# Patient Record
Sex: Female | Born: 1960
Health system: Southern US, Community
[De-identification: ages and names within clinical notes are randomized; demographics above are authoritative.]

## PROBLEM LIST (undated history)

## (undated) DIAGNOSIS — T4145XA Adverse effect of unspecified anesthetic, initial encounter: Secondary | ICD-10-CM

## (undated) DIAGNOSIS — E7800 Pure hypercholesterolemia, unspecified: Secondary | ICD-10-CM

## (undated) DIAGNOSIS — E039 Hypothyroidism, unspecified: Secondary | ICD-10-CM

## (undated) DIAGNOSIS — G4733 Obstructive sleep apnea (adult) (pediatric): Secondary | ICD-10-CM

## (undated) DIAGNOSIS — T8859XA Other complications of anesthesia, initial encounter: Secondary | ICD-10-CM

## (undated) DIAGNOSIS — K219 Gastro-esophageal reflux disease without esophagitis: Secondary | ICD-10-CM

## (undated) HISTORY — DX: Pure hypercholesterolemia, unspecified: E78.00

## (undated) HISTORY — PX: TONSILLECTOMY: SUR1361

## (undated) HISTORY — DX: Obstructive sleep apnea (adult) (pediatric): G47.33

## (undated) HISTORY — DX: Morbid (severe) obesity due to excess calories: E66.01

## (undated) HISTORY — DX: Hypothyroidism, unspecified: E03.9

---

## 1990-10-02 HISTORY — PX: GANGLION CYST EXCISION: SHX1691

## 1997-10-02 HISTORY — PX: DILATION AND CURETTAGE OF UTERUS: SHX78

## 2007-12-12 DIAGNOSIS — R7989 Other specified abnormal findings of blood chemistry: Secondary | ICD-10-CM | POA: Insufficient documentation

## 2008-11-25 DIAGNOSIS — L0292 Furuncle, unspecified: Secondary | ICD-10-CM | POA: Insufficient documentation

## 2008-11-25 DIAGNOSIS — L259 Unspecified contact dermatitis, unspecified cause: Secondary | ICD-10-CM | POA: Insufficient documentation

## 2008-12-02 DIAGNOSIS — Z8249 Family history of ischemic heart disease and other diseases of the circulatory system: Secondary | ICD-10-CM | POA: Insufficient documentation

## 2009-11-24 DIAGNOSIS — F4320 Adjustment disorder, unspecified: Secondary | ICD-10-CM | POA: Insufficient documentation

## 2010-01-06 DIAGNOSIS — R5381 Other malaise: Secondary | ICD-10-CM | POA: Insufficient documentation

## 2011-10-20 ENCOUNTER — Ambulatory Visit: Payer: Self-pay | Admitting: Family Medicine

## 2012-01-12 ENCOUNTER — Ambulatory Visit: Payer: Self-pay | Admitting: Family Medicine

## 2013-01-08 ENCOUNTER — Ambulatory Visit (INDEPENDENT_AMBULATORY_CARE_PROVIDER_SITE_OTHER): Payer: BC Managed Care – PPO | Admitting: General Surgery

## 2013-02-05 ENCOUNTER — Encounter (INDEPENDENT_AMBULATORY_CARE_PROVIDER_SITE_OTHER): Payer: Self-pay | Admitting: General Surgery

## 2013-02-05 ENCOUNTER — Ambulatory Visit (INDEPENDENT_AMBULATORY_CARE_PROVIDER_SITE_OTHER): Payer: BC Managed Care – PPO | Admitting: General Surgery

## 2013-02-05 VITALS — BP 122/78 | HR 64 | Resp 16 | Ht 61.0 in | Wt 280.0 lb

## 2013-02-05 DIAGNOSIS — Z6841 Body Mass Index (BMI) 40.0 and over, adult: Secondary | ICD-10-CM

## 2013-02-05 NOTE — Patient Instructions (Signed)
We will start our process. Please call with any questions.

## 2013-02-05 NOTE — Progress Notes (Signed)
Patient ID: Melissa Cherry, female   DOB: 1961/04/11, 52 y.o.   MRN: [id number]  Chief Complaint  Patient presents with  . Weight Loss Surgery    Gastric Bypass    HPI Melissa Cherry is a 52 y.o. female.   HPI 52 yo morbidly obese WF referred by Dr Leary Roca for evaluation of weight loss surgery. The patient states she is specifically interested in gastric bypass surgery. She states that she doesn't want any foreign bodies like the LapBand in herbody. She states that also the numerous return visits for LapBand are not conducive to her schedule. She states that the sleeve gastrectomy doesn't appeal to her since it is nonreversible and completely permanent. She states that she has researched gastric bypass and feels this is the best procedure for her.  She states she struggle with her weight most of her life however over the past several years her weight has increased. Despite numerous attempts for sustained weight loss the patient has been unsuccessful. She has tried [employer], bariatric clinic, [employer], and professional weight loss all without any long-term success. She was most successful with professional weight loss clinics when she was in her 93s.  Her comorbidities include obstructive sleep apnea on CPAP and hypercholesterolemia Past Medical History  Diagnosis Date  . Hypercholesteremia   . OSA (obstructive sleep apnea)     Past Surgical History  Procedure Laterality Date  . Tonsillectomy  as child  . Ganglion cyst excision Right 1992  . Cesarean section  200, 2003    Family History  Problem Relation Age of Onset  . Diabetes Father   . Breast cancer Mother     Social History History  Substance Use Topics  . Smoking status: Former Smoker    Quit date: 10/02/1996  . Smokeless tobacco: Not on file  . Alcohol Use: Yes     Comment: occasionally    Allergies  Allergen Reactions  . Tamiflu (Oseltamivir Phosphate) Rash    Current Outpatient Prescriptions  Medication  Sig Dispense Refill  . loratadine (CLARITIN) 10 MG tablet Take 10 mg by mouth daily.       No current facility-administered medications for this visit.    Review of Systems Review of Systems  Constitutional: Negative for fever, chills and unexpected weight change.  HENT: Negative for hearing loss, congestion, sore throat, trouble swallowing and voice change.   Eyes: Negative for visual disturbance.  Respiratory: Negative for cough, shortness of breath and wheezing.        Uses CPAP  Cardiovascular: Negative for chest pain, palpitations and leg swelling.       Some DOE after 1 flight; no PND, orthopnea  Gastrointestinal: Negative for nausea, vomiting, abdominal pain, diarrhea, constipation, blood in stool, abdominal distention and anal bleeding.       Reports nml screening colonoscopy; occasional reflux  Genitourinary: Negative for hematuria, vaginal bleeding, difficulty urinating and dyspareunia.       +menopause; G4P2; c/s x 2; some occasional urinary incontinence with cough/sneeze  Musculoskeletal: Negative for myalgias, back pain and arthralgias.  Skin: Negative for rash and wound.  Neurological: Negative for seizures, syncope and headaches.       Denies TIA, amaurosis fugax  Hematological: Negative for adenopathy. Does not bruise/bleed easily.  Psychiatric/Behavioral: Negative for confusion.    Blood pressure 122/78, pulse 64, resp. rate 16, height 5\' 1"  (1.549 m), weight 280 lb (127.007 kg).  Physical Exam Physical Exam  Vitals reviewed. Constitutional: She is oriented to person, place,  and time. She appears well-developed and well-nourished. No distress.  Morbid obese; apple shaped  HENT:  Head: Normocephalic and atraumatic.  Right Ear: External ear normal.  Left Ear: External ear normal.  Eyes: Conjunctivae are normal. No scleral icterus.  Neck: Normal range of motion. Neck supple. No tracheal deviation present. No thyromegaly present.  Cardiovascular: Normal rate,  regular rhythm, normal heart sounds and intact distal pulses.   Pulmonary/Chest: Effort normal and breath sounds normal. No respiratory distress. She has no wheezes.  Abdominal: Soft. Bowel sounds are normal. She exhibits no distension. There is no tenderness. There is no rebound and no guarding.    Musculoskeletal: Normal range of motion. She exhibits no edema and no tenderness.  Neurological: She is alert and oriented to person, place, and time. She exhibits normal muscle tone.  Skin: Skin is warm and dry. No rash noted. She is not diaphoretic. No erythema.  Psychiatric: She has a normal mood and affect. Her behavior is normal. Judgment and thought content normal.    Data Reviewed None available  Assessment    Morbid obesity BMI 52.9 OSA on CPAP Hypercholesterolemia     Plan    The patient meets weight loss surgery criteria. I think the patient would be an acceptable candidate for Laparoscopic Roux-en-Y Gastric bypass. She appears to have done adequate research  We discussed laparoscopic Roux-en-Y gastric bypass. We discussed the preoperative, operative and postoperative process. Using diagrams, I explained the surgery in detail including the performance of an EGD near the end of the surgery and an Upper GI swallow study on POD 1. We discussed the typical hospital course including a 2-3 day stay baring any complications.   The patient was given educational material. I quoted the patient that they can expect to lose 50-70% of their excess weight with the gastric bypass. We did discuss the possibility of weight regain several years after the procedure.  We discussed the risk and benefits of surgery including but not limited to anesthesia risk, bleeding, infection, anastomotic edema requiring a few additional days in the hospital, postop nausea, blood clot formation, anastomotic leak, anastomotic stricture, ulcer formation, death, respiratory complications, intestinal blockage, internal  hernia, gallstone formation, vitamin and nutritional deficiencies, injury to surrounding structures, failure to lose weight and mood changes.  We discussed that before and after surgery that there would be an alteration in their diet. I explained that we have put them on a diet 2 weeks before surgery. I also explained that they would be on a liquid diet for 2 weeks after surgery. We discussed that they would have to avoid certain foods such as sugar after surgery. We discussed the importance of physical activity as well as compliance with our dietary and supplement recommendations and routine follow-up.  I explained to the patient that we will start our evaluation process which includes labs, Upper GI to evaluate stomach and swallowing anatomy, nutritionist consultation, psychiatrist consultation, EKG, CXR, abdominal ultrasound,.  [name]. [name], MD, FACS General, Bariatric, & Minimally Invasive Surgery [facility] Surgery, [place]             [facility] M 02/05/2013, 12:50 PM

## 2013-02-19 LAB — COMPREHENSIVE METABOLIC PANEL
Albumin: 4 g/dL (ref 3.5–5.2)
CO2: 26 mEq/L (ref 19–32)
Calcium: 9.6 mg/dL (ref 8.4–10.5)
Glucose, Bld: 86 mg/dL (ref 70–99)
Potassium: 4.3 mEq/L (ref 3.5–5.3)
Sodium: 141 mEq/L (ref 135–145)
Total Protein: 6.7 g/dL (ref 6.0–8.3)

## 2013-02-19 LAB — TSH: TSH: 5.054 u[IU]/mL — ABNORMAL HIGH (ref 0.350–4.500)

## 2013-02-19 LAB — LIPID PANEL: Cholesterol: 279 mg/dL — ABNORMAL HIGH (ref 0–200)

## 2013-02-19 LAB — CBC WITH DIFFERENTIAL/PLATELET
Eosinophils Absolute: 0.2 10*3/uL (ref 0.0–0.7)
HCT: 41.7 % (ref 36.0–46.0)
Hemoglobin: 14.4 g/dL (ref 12.0–15.0)
Lymphs Abs: 2 10*3/uL (ref 0.7–4.0)
MCH: 30 pg (ref 26.0–34.0)
Monocytes Relative: 8 % (ref 3–12)
Neutro Abs: 3 10*3/uL (ref 1.7–7.7)
Neutrophils Relative %: 52 % (ref 43–77)
RBC: 4.8 MIL/uL (ref 3.87–5.11)

## 2013-02-20 LAB — H. PYLORI ANTIBODY, IGG: H Pylori IgG: 0.54 {ISR}

## 2013-02-25 ENCOUNTER — Ambulatory Visit (HOSPITAL_COMMUNITY)
Admission: RE | Admit: 2013-02-25 | Discharge: 2013-02-25 | Disposition: A | Discharge status: Home / Self Care | Payer: BC Managed Care – PPO | Source: Ambulatory Visit | Attending: General Surgery | Admitting: General Surgery

## 2013-02-25 ENCOUNTER — Other Ambulatory Visit: Payer: Self-pay

## 2013-02-25 DIAGNOSIS — J301 Allergic rhinitis due to pollen: Secondary | ICD-10-CM | POA: Insufficient documentation

## 2013-02-25 DIAGNOSIS — E7800 Pure hypercholesterolemia, unspecified: Secondary | ICD-10-CM | POA: Insufficient documentation

## 2013-02-25 DIAGNOSIS — K449 Diaphragmatic hernia without obstruction or gangrene: Secondary | ICD-10-CM | POA: Insufficient documentation

## 2013-02-25 DIAGNOSIS — G4733 Obstructive sleep apnea (adult) (pediatric): Secondary | ICD-10-CM | POA: Insufficient documentation

## 2013-02-25 DIAGNOSIS — K7689 Other specified diseases of liver: Secondary | ICD-10-CM | POA: Insufficient documentation

## 2013-02-25 DIAGNOSIS — Z6841 Body Mass Index (BMI) 40.0 and over, adult: Secondary | ICD-10-CM | POA: Insufficient documentation

## 2013-02-26 ENCOUNTER — Encounter: Payer: Self-pay | Admitting: *Deleted

## 2013-02-26 ENCOUNTER — Encounter: Payer: BC Managed Care – PPO | Attending: General Surgery | Admitting: *Deleted

## 2013-02-26 VITALS — Ht 61.0 in | Wt 279.1 lb

## 2013-02-26 DIAGNOSIS — Z01818 Encounter for other preprocedural examination: Secondary | ICD-10-CM | POA: Insufficient documentation

## 2013-02-26 DIAGNOSIS — Z713 Dietary counseling and surveillance: Secondary | ICD-10-CM | POA: Insufficient documentation

## 2013-02-26 DIAGNOSIS — Z6841 Body Mass Index (BMI) 40.0 and over, adult: Secondary | ICD-10-CM

## 2013-02-26 NOTE — Progress Notes (Signed)
  Pre-Op Assessment Visit:  Pre-Operative RYGB Surgery  Medical Nutrition Therapy:  Appt start time: 0930   End time:  1030.  Patient was seen on 02/26/2013 for Pre-Operative RYGB Nutrition Assessment. Assessment and letter of approval faxed to [facility] Bariatric Surgery Program coordinator on 02/26/2013.  Approval letter sent to [facility] Scan center and will be available in the chart under the media tab.  Handouts given during visit include:  Pre-Op Goals   Bariatric Surgery Protein Shakes  Patient to call for Pre-Op and Post-Op Nutrition Education at the [facility] when surgery is scheduled.

## 2013-02-26 NOTE — Patient Instructions (Addendum)
   Follow Pre-Op Nutrition Goals to prepare for Gastric Bypass Surgery.   Call the [facility] at [phone] once you have been given your surgery date to enrolled in the Pre-Op Nutrition Class. You will need to attend this nutrition class 3-4 weeks prior to your surgery. 

## 2013-03-25 ENCOUNTER — Other Ambulatory Visit (INDEPENDENT_AMBULATORY_CARE_PROVIDER_SITE_OTHER): Payer: Self-pay | Admitting: General Surgery

## 2013-04-02 ENCOUNTER — Ambulatory Visit (INDEPENDENT_AMBULATORY_CARE_PROVIDER_SITE_OTHER): Payer: BC Managed Care – PPO | Admitting: General Surgery

## 2013-04-02 ENCOUNTER — Encounter (INDEPENDENT_AMBULATORY_CARE_PROVIDER_SITE_OTHER): Payer: Self-pay | Admitting: General Surgery

## 2013-04-02 VITALS — BP 124/84 | HR 86 | Resp 16 | Ht 61.5 in | Wt 277.6 lb

## 2013-04-02 DIAGNOSIS — E7800 Pure hypercholesterolemia, unspecified: Secondary | ICD-10-CM

## 2013-04-02 DIAGNOSIS — G4733 Obstructive sleep apnea (adult) (pediatric): Secondary | ICD-10-CM

## 2013-04-02 DIAGNOSIS — Z6841 Body Mass Index (BMI) 40.0 and over, adult: Secondary | ICD-10-CM

## 2013-04-02 MED ORDER — PEG-KCL-NACL-NASULF-NA ASC-C 100 G PO SOLR: 1.0000 | Freq: Once | ORAL | Status: DC

## 2013-04-02 NOTE — Patient Instructions (Signed)
Keep up with preop diet - you can do it Call your primary doctor if cough/cold worsens &/or doesn't improve Pick up bowel prep and perform the day before surgery

## 2013-04-03 ENCOUNTER — Encounter (INDEPENDENT_AMBULATORY_CARE_PROVIDER_SITE_OTHER): Payer: Self-pay | Admitting: General Surgery

## 2013-04-03 NOTE — Progress Notes (Signed)
Patient ID: Melissa Cherry, female   DOB: 06/16/1961, 52 y.o.   MRN: [id number]  Chief complaint: Here for preop visit  HPI Melissa Cherry is a 52 y.o. female.   HPI 52 year old morbidly obese Caucasian female comes in today for a preoperative appointment. She is scheduled to undergo a laparoscopic Roux-en-Y gastric bypass on July 14. She has started her preoperative diet. She is little bit nervous about the upcoming surgery. She states that she was prescribed Synthroid recently for borderline high TSH. She also states that she developed a cough and a cold about 3 days ago. She denies any fevers or chills. She has had some sinus drainage. She states the drainage is clear. Past Medical History  Diagnosis Date  . Hypercholesteremia   . OSA (obstructive sleep apnea)   . Morbid obesity     Past Surgical History  Procedure Laterality Date  . Tonsillectomy  as child  . Ganglion cyst excision Right 1992  . Cesarean section  2000, 2003  . Dilation and curettage of uterus  1999    Family History  Problem Relation Age of Onset  . Diabetes Father   . Breast cancer Mother     Social History History  Substance Use Topics  . Smoking status: Former Smoker    Quit date: 10/02/1996  . Smokeless tobacco: Not on file  . Alcohol Use: Yes     Comment: Occasionally; 2-3 beers/week at most (during summer)    Allergies  Allergen Reactions  . Tamiflu (Oseltamivir Phosphate) Rash    Current Outpatient Prescriptions  Medication Sig Dispense Refill  . loratadine (CLARITIN) 10 MG tablet Take 10 mg by mouth daily.      . peg 3350 powder (MOVIPREP) 100 G SOLR Take 1 kit (100 g total) by mouth once.  1 kit  0   No current facility-administered medications for this visit.    Review of Systems Review of Systems  Constitutional: Negative for fever, chills and unexpected weight change.  HENT: Positive for congestion and postnasal drip. Negative for hearing loss, sore throat, trouble swallowing, voice  change and sinus pressure.   Eyes: Negative for visual disturbance.  Respiratory: Positive for cough. Negative for wheezing.        Uses CPAP  Cardiovascular: Negative for chest pain, palpitations and leg swelling.       Some dyspnea on exertion. Denies orthopnea, paroxysmal nocturnal dyspnea  Gastrointestinal: Negative for nausea, vomiting, abdominal pain, diarrhea, constipation, blood in stool, abdominal distention and anal bleeding.  Genitourinary: Negative for hematuria, vaginal bleeding and difficulty urinating.       Has some urinary incontinence on occasion with coughing/sneezing  Musculoskeletal: Negative for arthralgias.  Skin: Negative for rash and wound.  Neurological: Negative for seizures, syncope and headaches.       Denies TIAs and amaurosis fugax  Hematological: Negative for adenopathy. Does not bruise/bleed easily.  Psychiatric/Behavioral: Negative for confusion.    Blood pressure 124/84, pulse 86, resp. rate 16, height 5' 1.5" (1.562 m), weight 277 lb 9.6 oz (125.919 kg).  Physical Exam Physical Exam  Vitals reviewed. Constitutional: She is oriented to person, place, and time. She appears well-developed and well-nourished. No distress.  Morbidly obese  HENT:  Head: Normocephalic and atraumatic.  Right Ear: External ear normal.  Left Ear: External ear normal.  Eyes: Conjunctivae and EOM are normal. No scleral icterus.  Neck: No tracheal deviation present. No thyromegaly present.  Cardiovascular: Normal rate, regular rhythm and normal heart sounds.  Pulmonary/Chest: Effort normal and breath sounds normal. No respiratory distress. She has no wheezes.  Abdominal: Soft. She exhibits no distension. There is no tenderness. There is no rebound.    Musculoskeletal: Normal range of motion. She exhibits no edema and no tenderness.  Lymphadenopathy:    She has no cervical adenopathy.  Neurological: She is alert and oriented to person, place, and time.  Skin: Skin is warm  and dry. No rash noted. She is not diaphoretic. No erythema.  Psychiatric: She has a normal mood and affect. Her behavior is normal. Judgment and thought content normal.    Data Reviewed My office note 5/7 TSH elevated at 5.05, nml T4 Chol 279, LDL 210 o/w evaluation labs ok UGI- small sliding HH abd u/s - fatty liver  Assessment    Morbid obesity BMI 51.6 OSA on CPAP Hypercholesterolemia Hypothyroidism Fatty liver      Plan    We reviewed her preoperative workup. We discussed the importance of her compliance with the preoperative diet. I discussed How important  that she try to lose a few pounds before surgery in order to help shrink her liver.  I encouraged her to start taking her Synthroid as it was prescribed by her primary care physician.  With respect to her cough and cold, I told her to contact her primary care physician if it worsens or if it doesn't improve by Friday of this week. If it persists we may have to move back her surgery  She was given a prescription for her bowel prep and given instructions.  All of her questions were asked and answered.       Gaynelle Adu M 04/03/2013, 10:26 AM

## 2013-04-09 ENCOUNTER — Telehealth (INDEPENDENT_AMBULATORY_CARE_PROVIDER_SITE_OTHER): Payer: Self-pay | Admitting: General Surgery

## 2013-04-09 NOTE — Telephone Encounter (Signed)
Called to check on pt per verbal orders per EW to see how she was feeling and how the cough was...when she was here for her appt last week she had cough and cold symptoms which EW just wanted to verify that she was feeling better to proceed with surgery on 04/14/13.Marland KitchenMarland Kitchenpatient stated that she was and I made EW aware he stated that we may proceed with surgery after speaking with her ..patient is aware and in agreement

## 2013-04-10 ENCOUNTER — Encounter (HOSPITAL_COMMUNITY): Payer: Self-pay

## 2013-04-10 ENCOUNTER — Encounter (HOSPITAL_COMMUNITY): Payer: Self-pay | Admitting: Pharmacy Technician

## 2013-04-10 ENCOUNTER — Encounter (HOSPITAL_COMMUNITY)
Admission: RE | Admit: 2013-04-10 | Discharge: 2013-04-10 | Disposition: A | Discharge status: Home / Self Care | Payer: BC Managed Care – PPO | Source: Ambulatory Visit | Attending: General Surgery | Admitting: General Surgery

## 2013-04-10 ENCOUNTER — Encounter: Payer: Self-pay | Admitting: *Deleted

## 2013-04-10 ENCOUNTER — Encounter: Payer: BC Managed Care – PPO | Attending: General Surgery | Admitting: *Deleted

## 2013-04-10 VITALS — Ht 61.0 in | Wt 273.5 lb

## 2013-04-10 DIAGNOSIS — Z6841 Body Mass Index (BMI) 40.0 and over, adult: Secondary | ICD-10-CM

## 2013-04-10 DIAGNOSIS — Z01818 Encounter for other preprocedural examination: Secondary | ICD-10-CM | POA: Insufficient documentation

## 2013-04-10 DIAGNOSIS — Z01812 Encounter for preprocedural laboratory examination: Secondary | ICD-10-CM | POA: Insufficient documentation

## 2013-04-10 DIAGNOSIS — Z713 Dietary counseling and surveillance: Secondary | ICD-10-CM | POA: Insufficient documentation

## 2013-04-10 HISTORY — DX: Adverse effect of unspecified anesthetic, initial encounter: T41.45XA

## 2013-04-10 HISTORY — DX: Gastro-esophageal reflux disease without esophagitis: K21.9

## 2013-04-10 HISTORY — DX: Other complications of anesthesia, initial encounter: T88.59XA

## 2013-04-10 LAB — COMPREHENSIVE METABOLIC PANEL
ALT: 55 U/L — ABNORMAL HIGH (ref 0–35)
AST: 54 U/L — ABNORMAL HIGH (ref 0–37)
Albumin: 4 g/dL (ref 3.5–5.2)
Alkaline Phosphatase: 98 U/L (ref 39–117)
Potassium: 4 mEq/L (ref 3.5–5.1)
Sodium: 139 mEq/L (ref 135–145)
Total Protein: 7.6 g/dL (ref 6.0–8.3)

## 2013-04-10 LAB — CBC WITH DIFFERENTIAL/PLATELET
Basophils Relative: 1 % (ref 0–1)
Eosinophils Absolute: 0.3 10*3/uL (ref 0.0–0.7)
Lymphs Abs: 2.2 10*3/uL (ref 0.7–4.0)
MCH: 30 pg (ref 26.0–34.0)
MCHC: 34.4 g/dL (ref 30.0–36.0)
Neutrophils Relative %: 59 % (ref 43–77)
Platelets: 222 10*3/uL (ref 150–400)
RBC: 4.8 MIL/uL (ref 3.87–5.11)

## 2013-04-10 NOTE — Progress Notes (Addendum)
Bariatric Class:  Appt start time: 1730 end time:  1830.  Pre-Operative Nutrition Class  Patient was seen on 04/10/2013 for Pre-Operative Bariatric Surgery Education at [facility].   Surgery date: 04/14/13 Surgery type: RYGB Start weight at [facility]: 279.1 lbs (02/26/13)  Weight today: 273.5 lbs Weight change: 5.6 lbs Total weight lost: 5.6 lbs  TANITA  BODY COMP RESULTS  04/10/13   BMI (kg/m^2) 51.7   Fat Mass (lbs) 141.5   Fat Free Mass (lbs) 132.0   Total Body Water (lbs) 96.5   Samples given per MNT protocol; Patient educated on appropriate usage: Bariatric Advantage Multivitamin Lot: [id number] Exp: 06/15  Bariatric Advantage Calcium Citrate Lot: [id number] Exp: 10/15  Celebrate Vitamins Multivitamin Lot: [id number] Exp: 01/15  Celebrate Vitamins Sublingual B12 Lot: [id number] Exp: 11/15  Unjury Protein Powder Lot: [id number] Exp: 07/15  The following the learning objective met by the patient during this course:  Identify Pre-Op Dietary Goals and will begin 2 weeks pre-operatively  Identify appropriate sources of fluids and proteins   State protein recommendations and appropriate sources pre and post-operatively  Identify Post-Operative Dietary Goals and will follow for 2 weeks post-operatively  Identify appropriate multivitamin and calcium sources  Describe the need for physical activity post-operatively and will follow MD recommendations  State when to call healthcare provider regarding medication questions or post-operative complications  Handouts given during class include:  Pre-Op Bariatric Surgery Diet Handout  Protein Shake Handout  Post-Op Bariatric Surgery Nutrition Handout  BELT Program Information Flyer  Support Group Information Flyer  [facility] Bariatric Supplements Price List  Follow-Up Plan: Patient will follow-up at [facility] 2 weeks post operatively for diet advancement per MD.

## 2013-04-10 NOTE — Progress Notes (Signed)
Polysomnography Interpretation from 10/20/2011 and CPAP Interpretation from 01/12/2012 from [facility] all on chart.

## 2013-04-10 NOTE — Patient Instructions (Signed)
Follow:   Pre-Op Diet per MD 2 weeks prior to surgery  Phase 2- Liquids (clear/full) 2 weeks after surgery  Vitamin/Mineral/Calcium guidelines for purchasing bariatric supplements  Exercise guidelines pre and post-op per MD  Follow-up at [facility] in 2 weeks post-op for diet advancement. Contact Jolin Benavides as needed with questions/concerns. 

## 2013-04-10 NOTE — Patient Instructions (Addendum)
20      Your procedure is scheduled on:  Monday 04/14/2013  Report to [facility] at  0715 AM.  Call this number if you have problems the morning of surgery: [phone]   Remember: FOLLOW BOWEL PREP INSTRUCTIONS FROM DR. Tawana Scale OFFICE AND FOLLOW CLEAR LIQUID DIET ALL DAY TIL MIDNIGHT!             IF YOU USE CPAP,BRING MASK AND TUBING AM OF SURGERY!   Do not eat food or drink liquids AFTER MIDNIGHT!  Take these medicines the morning of surgery with A SIP OF WATER: Levothyroxine (Synthroid)   Do not bring valuables to the hospital. [facility] IS NOT RESPONSIBLE   FOR ANY BELONGINGS OR VALUABLES.  Wynelle Fanny suitcase in the car. After surgery it may be brought to your room.  For patients admitted to the hospital, checkout time is 11:00 AM the day of              Discharge.    DO NOT WEAR JEWELRY , MAKE-UP, LOTIONS,POWDERS,PERFUMES!             WOMEN -DO NOT SHAVE LEGS OR UNDERARMS 12 HRS. BEFORE SURGERY!               MEN MAY SHAVE AS USUAL!             CONTACTS,DENTURES OR BRIDGEWORK, FALSE EYELASHES MAY  NOT BE WORN INTO SURGERY!                                           Patients discharged the day of surgery will not be allowed to drive home.  If going home the same day of surgery, must have someone stay with you  first 24 hrs.at home and arrange for someone to drive you home from the  Hospital.                         YOUR DRIVER IS: Phil-spouse    Special Instructions:             Please read over the following fact sheets that you were given:             1. [facility] PREPARING FOR SURGERY SHEET              2.MRSA INFORMATION              3.[facility] [employer]                                        Telford Nab.Marvella Jenning,RN,BSN     [id number]                FAILURE TO FOLLOW THESE INSTRUCTIONS MAY RESULT IN  CANCELLATION OF YOUR SURGERY!               Patient Signature:___________________________

## 2013-04-11 ENCOUNTER — Telehealth (INDEPENDENT_AMBULATORY_CARE_PROVIDER_SITE_OTHER): Payer: Self-pay | Admitting: General Surgery

## 2013-04-11 NOTE — Telephone Encounter (Signed)
Called patient to make sure her cough that she had in the office last week had improved. She states her cold had resolved. She denies any fevers, chills, cough, sputum production. I answered all her Remaining questions about surgery.Gastric bypass surgery scheduled for this Monday

## 2013-04-14 ENCOUNTER — Encounter (HOSPITAL_COMMUNITY): Payer: Self-pay | Admitting: Anesthesiology

## 2013-04-14 ENCOUNTER — Inpatient Hospital Stay (HOSPITAL_COMMUNITY): Payer: BC Managed Care – PPO | Admitting: Anesthesiology

## 2013-04-14 ENCOUNTER — Inpatient Hospital Stay (HOSPITAL_COMMUNITY)
Admission: RE | Admit: 2013-04-14 | Discharge: 2013-04-16 | DRG: 288 | Disposition: A | Discharge status: Home / Self Care | Payer: BC Managed Care – PPO | Source: Ambulatory Visit | Attending: General Surgery | Admitting: General Surgery

## 2013-04-14 ENCOUNTER — Encounter (HOSPITAL_COMMUNITY): Payer: Self-pay | Admitting: *Deleted

## 2013-04-14 ENCOUNTER — Encounter (HOSPITAL_COMMUNITY)
Admission: RE | Disposition: A | Discharge status: Home / Self Care | Payer: Self-pay | Source: Ambulatory Visit | Attending: General Surgery

## 2013-04-14 DIAGNOSIS — K7689 Other specified diseases of liver: Secondary | ICD-10-CM | POA: Diagnosis present

## 2013-04-14 DIAGNOSIS — E7800 Pure hypercholesterolemia, unspecified: Secondary | ICD-10-CM

## 2013-04-14 DIAGNOSIS — Z6841 Body Mass Index (BMI) 40.0 and over, adult: Secondary | ICD-10-CM

## 2013-04-14 DIAGNOSIS — Z79899 Other long term (current) drug therapy: Secondary | ICD-10-CM

## 2013-04-14 DIAGNOSIS — G4733 Obstructive sleep apnea (adult) (pediatric): Secondary | ICD-10-CM

## 2013-04-14 DIAGNOSIS — E039 Hypothyroidism, unspecified: Secondary | ICD-10-CM | POA: Diagnosis present

## 2013-04-14 DIAGNOSIS — K760 Fatty (change of) liver, not elsewhere classified: Secondary | ICD-10-CM | POA: Diagnosis present

## 2013-04-14 HISTORY — PX: GASTRIC ROUX-EN-Y: SHX5262

## 2013-04-14 LAB — HEMOGLOBIN AND HEMATOCRIT, BLOOD: HCT: 43.9 % (ref 36.0–46.0)

## 2013-04-14 SURGERY — LAPAROSCOPIC ROUX-EN-Y GASTRIC BYPASS WITH UPPER ENDOSCOPY
Anesthesia: General | Site: Abdomen | Wound class: Clean Contaminated

## 2013-04-14 MED ORDER — ONDANSETRON HCL 4 MG/2ML IJ SOLN
INTRAMUSCULAR | Status: DC | PRN
  Administered 2013-04-14 (×2): 2 mg via INTRAVENOUS

## 2013-04-14 MED ORDER — HYDRALAZINE HCL 20 MG/ML IJ SOLN
INTRAMUSCULAR | Status: DC | PRN
  Administered 2013-04-14: 5 mg via INTRAVENOUS

## 2013-04-14 MED ORDER — LACTATED RINGERS IV SOLN
INTRAVENOUS | Status: DC | PRN
  Administered 2013-04-14 (×2): via INTRAVENOUS

## 2013-04-14 MED ORDER — OXYCODONE-ACETAMINOPHEN 5-325 MG/5ML PO SOLN
5.0000 mL | ORAL | Status: DC | PRN
  Administered 2013-04-15 – 2013-04-16 (×4): 5 mL via ORAL
  Filled 2013-04-14: qty 5
  Filled 2013-04-14: qty 10
  Filled 2013-04-14 (×2): qty 5

## 2013-04-14 MED ORDER — MORPHINE SULFATE 2 MG/ML IJ SOLN
2.0000 mg | INTRAMUSCULAR | Status: DC | PRN
  Administered 2013-04-14 – 2013-04-15 (×5): 4 mg via INTRAVENOUS
  Filled 2013-04-14 (×5): qty 2

## 2013-04-14 MED ORDER — NEOSTIGMINE METHYLSULFATE 1 MG/ML IJ SOLN
INTRAMUSCULAR | Status: DC | PRN
  Administered 2013-04-14: 3 mg via INTRAVENOUS

## 2013-04-14 MED ORDER — BUPIVACAINE-EPINEPHRINE 0.25% -1:200000 IJ SOLN
INTRAMUSCULAR | Status: DC | PRN
  Administered 2013-04-14: 50 mL

## 2013-04-14 MED ORDER — MORPHINE SULFATE 2 MG/ML IJ SOLN
INTRAMUSCULAR | Status: AC
  Administered 2013-04-14: 2 mg via INTRAMUSCULAR
  Filled 2013-04-14: qty 1

## 2013-04-14 MED ORDER — PROPOFOL INFUSION 10 MG/ML OPTIME
INTRAVENOUS | Status: DC | PRN
  Administered 2013-04-14: 50 mL via INTRAVENOUS
  Administered 2013-04-14: 30 mL via INTRAVENOUS

## 2013-04-14 MED ORDER — UNJURY CHICKEN SOUP POWDER
2.0000 [oz_av] | Freq: Four times a day (QID) | ORAL | Status: DC
  Filled 2013-04-14 (×4): qty 27

## 2013-04-14 MED ORDER — HEPARIN SODIUM (PORCINE) 5000 UNIT/ML IJ SOLN
5000.0000 [IU] | INTRAMUSCULAR | Status: AC
  Administered 2013-04-14: 5000 [IU] via SUBCUTANEOUS
  Filled 2013-04-14: qty 1

## 2013-04-14 MED ORDER — LACTATED RINGERS IV SOLN: INTRAVENOUS | Status: DC

## 2013-04-14 MED ORDER — MORPHINE SULFATE 4 MG/ML IJ SOLN
INTRAMUSCULAR | Status: AC
  Administered 2013-04-14: 4 mg
  Filled 2013-04-14: qty 1

## 2013-04-14 MED ORDER — ONDANSETRON HCL 4 MG/2ML IJ SOLN
4.0000 mg | INTRAMUSCULAR | Status: DC | PRN
  Administered 2013-04-14 – 2013-04-15 (×2): 4 mg via INTRAVENOUS
  Filled 2013-04-14 (×2): qty 2

## 2013-04-14 MED ORDER — KETAMINE HCL 10 MG/ML IJ SOLN
INTRAMUSCULAR | Status: DC | PRN
  Administered 2013-04-14: 15 mg via INTRAVENOUS
  Administered 2013-04-14: 5 mg via INTRAVENOUS
  Administered 2013-04-14: 15 mg via INTRAVENOUS

## 2013-04-14 MED ORDER — FENTANYL CITRATE 0.05 MG/ML IJ SOLN
INTRAMUSCULAR | Status: DC | PRN
  Administered 2013-04-14: 75 ug via INTRAVENOUS
  Administered 2013-04-14 (×2): 50 ug via INTRAVENOUS
  Administered 2013-04-14: 100 ug via INTRAVENOUS
  Administered 2013-04-14: 50 ug via INTRAVENOUS
  Administered 2013-04-14: 100 ug via INTRAVENOUS
  Administered 2013-04-14: 25 ug via INTRAVENOUS

## 2013-04-14 MED ORDER — KCL IN DEXTROSE-NACL 20-5-0.45 MEQ/L-%-% IV SOLN
INTRAVENOUS | Status: DC
  Administered 2013-04-14 – 2013-04-16 (×4): via INTRAVENOUS
  Filled 2013-04-14 (×8): qty 1000

## 2013-04-14 MED ORDER — EPHEDRINE SULFATE 50 MG/ML IJ SOLN
INTRAMUSCULAR | Status: DC | PRN
  Administered 2013-04-14: 10 mg via INTRAVENOUS

## 2013-04-14 MED ORDER — TISSEEL VH 10 ML EX KIT
PACK | CUTANEOUS | Status: DC | PRN
  Administered 2013-04-14: 10 mL

## 2013-04-14 MED ORDER — GLYCOPYRROLATE 0.2 MG/ML IJ SOLN
INTRAMUSCULAR | Status: DC | PRN
  Administered 2013-04-14: 0.4 mg via INTRAVENOUS

## 2013-04-14 MED ORDER — DEXTROSE 5 % IV SOLN
2.0000 g | INTRAVENOUS | Status: AC
  Administered 2013-04-14: 2 g via INTRAVENOUS

## 2013-04-14 MED ORDER — MORPHINE SULFATE 10 MG/ML IJ SOLN: 2.0000 mg | INTRAMUSCULAR | Status: DC | PRN

## 2013-04-14 MED ORDER — UNJURY VANILLA POWDER
2.0000 [oz_av] | Freq: Four times a day (QID) | ORAL | Status: DC
  Administered 2013-04-16 (×2): 2 [oz_av] via ORAL
  Filled 2013-04-14 (×4): qty 27

## 2013-04-14 MED ORDER — MIDAZOLAM HCL 5 MG/5ML IJ SOLN
INTRAMUSCULAR | Status: DC | PRN
  Administered 2013-04-14: 1 mg via INTRAVENOUS

## 2013-04-14 MED ORDER — SODIUM CHLORIDE 0.9 % IV SOLN
INTRAVENOUS | Status: DC | PRN
  Administered 2013-04-14: 13:00:00 via INTRAVENOUS

## 2013-04-14 MED ORDER — LACTATED RINGERS IR SOLN
Status: DC | PRN
  Administered 2013-04-14: 1000 mL

## 2013-04-14 MED ORDER — HYDROMORPHONE HCL PF 1 MG/ML IJ SOLN
INTRAMUSCULAR | Status: DC | PRN
  Administered 2013-04-14 (×2): 1 mg via INTRAVENOUS

## 2013-04-14 MED ORDER — DEXAMETHASONE SODIUM PHOSPHATE 4 MG/ML IJ SOLN
INTRAMUSCULAR | Status: DC | PRN
  Administered 2013-04-14: 10 mg via INTRAVENOUS

## 2013-04-14 MED ORDER — PROMETHAZINE HCL 25 MG/ML IJ SOLN: 6.2500 mg | INTRAMUSCULAR | Status: DC | PRN

## 2013-04-14 MED ORDER — HYDROMORPHONE HCL PF 1 MG/ML IJ SOLN
0.2500 mg | INTRAMUSCULAR | Status: DC | PRN
  Administered 2013-04-14 (×4): 0.5 mg via INTRAVENOUS

## 2013-04-14 MED ORDER — CISATRACURIUM BESYLATE (PF) 10 MG/5ML IV SOLN
INTRAVENOUS | Status: DC | PRN
  Administered 2013-04-14 (×2): 2 mg via INTRAVENOUS
  Administered 2013-04-14: 10 mg via INTRAVENOUS
  Administered 2013-04-14: 2 mg via INTRAVENOUS

## 2013-04-14 MED ORDER — HYDROMORPHONE HCL PF 1 MG/ML IJ SOLN
1.0000 mg | Freq: Once | INTRAMUSCULAR | Status: AC
  Administered 2013-04-14: 1 mg via INTRAVENOUS

## 2013-04-14 MED ORDER — SUCCINYLCHOLINE CHLORIDE 20 MG/ML IJ SOLN
INTRAMUSCULAR | Status: DC | PRN
  Administered 2013-04-14: 140 mg via INTRAVENOUS

## 2013-04-14 MED ORDER — SODIUM CHLORIDE 0.9 % IV SOLN: INTRAVENOUS | Status: DC

## 2013-04-14 MED ORDER — UNJURY CHOCOLATE CLASSIC POWDER
2.0000 [oz_av] | Freq: Four times a day (QID) | ORAL | Status: DC
  Filled 2013-04-14 (×4): qty 27

## 2013-04-14 MED ORDER — LIDOCAINE HCL (CARDIAC) 20 MG/ML IV SOLN
INTRAVENOUS | Status: DC | PRN
  Administered 2013-04-14: 30 mg via INTRAVENOUS

## 2013-04-14 MED ORDER — PROPOFOL 10 MG/ML IV BOLUS
INTRAVENOUS | Status: DC | PRN
  Administered 2013-04-14: 250 mg via INTRAVENOUS

## 2013-04-14 MED ORDER — PANTOPRAZOLE SODIUM 40 MG IV SOLR
40.0000 mg | INTRAVENOUS | Status: DC
  Administered 2013-04-14: 40 mg via INTRAVENOUS
  Filled 2013-04-14 (×3): qty 40

## 2013-04-14 MED ORDER — 0.9 % SODIUM CHLORIDE (POUR BTL) OPTIME
TOPICAL | Status: DC | PRN
  Administered 2013-04-14: 1000 mL

## 2013-04-14 MED ORDER — ACETAMINOPHEN 160 MG/5ML PO SOLN
650.0000 mg | ORAL | Status: DC | PRN
  Filled 2013-04-14: qty 20.3

## 2013-04-14 MED ORDER — ENOXAPARIN SODIUM 40 MG/0.4ML ~~LOC~~ SOLN
40.0000 mg | Freq: Two times a day (BID) | SUBCUTANEOUS | Status: DC
  Administered 2013-04-15 – 2013-04-16 (×3): 40 mg via SUBCUTANEOUS
  Filled 2013-04-14 (×5): qty 0.4

## 2013-04-14 SURGICAL SUPPLY — 71 items
APPLICATOR COTTON TIP 6IN STRL (MISCELLANEOUS) ×4 IMPLANT
BENZOIN TINCTURE PRP APPL 2/3 (GAUZE/BANDAGES/DRESSINGS) IMPLANT
BLADE SURG 15 STRL LF DISP TIS (BLADE) ×1 IMPLANT
BLADE SURG 15 STRL SS (BLADE) ×1
BLADE SURG SZ11 CARB STEEL (BLADE) ×2 IMPLANT
CABLE HIGH FREQUENCY MONO STRZ (ELECTRODE) ×2 IMPLANT
CLIP SUT LAPRA TY ABSORB (SUTURE) ×4 IMPLANT
CLOTH BEACON ORANGE TIMEOUT ST (SAFETY) ×2 IMPLANT
COVER SURGICAL LIGHT HANDLE (MISCELLANEOUS) IMPLANT
CUTTER LINEAR ENDO ART 45 ETS (STAPLE) ×2 IMPLANT
DERMABOND ADVANCED (GAUZE/BANDAGES/DRESSINGS)
DERMABOND ADVANCED .7 DNX12 (GAUZE/BANDAGES/DRESSINGS) IMPLANT
DEVICE SUTURE ENDOST 10MM (ENDOMECHANICALS) ×4 IMPLANT
DISSECTOR BLUNT TIP ENDO 5MM (MISCELLANEOUS) IMPLANT
DRAIN PENROSE 18X1/4 LTX STRL (WOUND CARE) ×2 IMPLANT
DRAPE CAMERA CLOSED 9X96 (DRAPES) ×2 IMPLANT
DRAPE UTILITY XL STRL (DRAPES) ×2 IMPLANT
ELECT REM PT RETURN 9FT ADLT (ELECTROSURGICAL) ×2
ELECTRODE REM PT RTRN 9FT ADLT (ELECTROSURGICAL) ×1 IMPLANT
GAUZE SPONGE 4X4 16PLY XRAY LF (GAUZE/BANDAGES/DRESSINGS) ×2 IMPLANT
GLOVE BIO SURGEON STRL SZ7.5 (GLOVE) ×4 IMPLANT
GLOVE BIOGEL M STRL SZ7.5 (GLOVE) ×4 IMPLANT
GLOVE BIOGEL PI IND STRL 7.0 (GLOVE) ×1 IMPLANT
GLOVE BIOGEL PI INDICATOR 7.0 (GLOVE) ×1
GLOVE INDICATOR 8.0 STRL GRN (GLOVE) ×2 IMPLANT
GOWN STRL NON-REIN LRG LVL3 (GOWN DISPOSABLE) ×2 IMPLANT
GOWN STRL REIN XL XLG (GOWN DISPOSABLE) ×6 IMPLANT
HOVERMATT SINGLE USE (MISCELLANEOUS) ×2 IMPLANT
KIT BASIN OR (CUSTOM PROCEDURE TRAY) ×2 IMPLANT
KIT GASTRIC LAVAGE 34FR ADT (SET/KITS/TRAYS/PACK) ×2 IMPLANT
MARKER SKIN DUAL TIP RULER LAB (MISCELLANEOUS) ×2 IMPLANT
NEEDLE SPNL 22GX3.5 QUINCKE BK (NEEDLE) ×2 IMPLANT
NS IRRIG 1000ML POUR BTL (IV SOLUTION) ×2 IMPLANT
PACK CARDIOVASCULAR III (CUSTOM PROCEDURE TRAY) ×2 IMPLANT
RELOAD 45 VASCULAR/THIN (ENDOMECHANICALS) ×2 IMPLANT
RELOAD BLUE (STAPLE) ×4 IMPLANT
RELOAD ENDO STITCH 2.0 (ENDOMECHANICALS) ×9
RELOAD GOLD (STAPLE) ×2 IMPLANT
RELOAD STAPLE TA45 3.5 REG BLU (ENDOMECHANICALS) ×10 IMPLANT
RELOAD WHITE ECR60W (STAPLE) ×2 IMPLANT
SCALPEL HARMONIC ACE (MISCELLANEOUS) ×2 IMPLANT
SCISSORS LAP 5X35 DISP (ENDOMECHANICALS) ×2 IMPLANT
SEALANT SURGICAL APPL DUAL CAN (MISCELLANEOUS) ×2 IMPLANT
SET IRRIG TUBING LAPAROSCOPIC (IRRIGATION / IRRIGATOR) ×2 IMPLANT
SLEEVE ADV FIXATION 12X100MM (TROCAR) ×4 IMPLANT
SLEEVE ADV FIXATION 5X100MM (TROCAR) ×2 IMPLANT
SOLUTION ANTI FOG 6CC (MISCELLANEOUS) ×2 IMPLANT
SPONGE GAUZE 4X4 12PLY (GAUZE/BANDAGES/DRESSINGS) ×2 IMPLANT
STAPLE ECHEON FLEX 60 POW ENDO (STAPLE) ×2 IMPLANT
STAPLER VISISTAT 35W (STAPLE) ×2 IMPLANT
STRIP CLOSURE SKIN 1/2X4 (GAUZE/BANDAGES/DRESSINGS) IMPLANT
SUT MNCRL AB 4-0 PS2 18 (SUTURE) ×2 IMPLANT
SUT RELOAD ENDO STITCH 2 48X1 (ENDOMECHANICALS) ×5
SUT RELOAD ENDO STITCH 2.0 (ENDOMECHANICALS) ×4
SUT SILK 2 0 SH (SUTURE) IMPLANT
SUT VIC AB 2-0 SH 27 (SUTURE) ×1
SUT VIC AB 2-0 SH 27X BRD (SUTURE) ×1 IMPLANT
SUTURE RELOAD END STTCH 2 48X1 (ENDOMECHANICALS) ×5 IMPLANT
SUTURE RELOAD ENDO STITCH 2.0 (ENDOMECHANICALS) ×4 IMPLANT
SYR 20CC LL (SYRINGE) ×2 IMPLANT
SYR 50ML LL SCALE MARK (SYRINGE) ×2 IMPLANT
SYR CONTROL 10ML LL (SYRINGE) ×4 IMPLANT
TOWEL OR 17X26 10 PK STRL BLUE (TOWEL DISPOSABLE) ×2 IMPLANT
TRAY FOLEY CATH 14FRSI W/METER (CATHETERS) ×2 IMPLANT
TROCAR BALLN 12MMX100 BLUNT (TROCAR) IMPLANT
TROCAR BLADELESS OPT 5 100 (ENDOMECHANICALS) ×2 IMPLANT
TROCAR ENDOPATH XCEL 12X100 BL (ENDOMECHANICALS) ×6 IMPLANT
TROCAR XCEL 12X100 BLDLESS (ENDOMECHANICALS) ×2 IMPLANT
TUBING ENDO SMARTCAP (MISCELLANEOUS) ×2 IMPLANT
TUBING FILTER THERMOFLATOR (ELECTROSURGICAL) ×2 IMPLANT
WATER STERILE IRR 1500ML POUR (IV SOLUTION) ×2 IMPLANT

## 2013-04-14 NOTE — Anesthesia Preprocedure Evaluation (Addendum)
Anesthesia Evaluation  Patient identified by MRN, date of birth, ID band Patient awake    Reviewed: Allergy & Precautions, H&P , NPO status , Patient's Chart, lab work & pertinent test results  Airway Mallampati: III TM Distance: >3 FB Neck ROM: Full    Dental  (+) Teeth Intact and Dental Advisory Given   Pulmonary sleep apnea and Continuous Positive Airway Pressure Ventilation ,  breath sounds clear to auscultation  Pulmonary exam normal       Cardiovascular negative cardio ROS  Rhythm:Regular Rate:Normal     Neuro/Psych negative neurological ROS  negative psych ROS   GI/Hepatic Neg liver ROS, GERD-  Medicated,  Endo/Other  Hypothyroidism Morbid obesity  Renal/GU negative Renal ROS  negative genitourinary   Musculoskeletal negative musculoskeletal ROS (+)   Abdominal   Peds  Hematology negative hematology ROS (+)   Anesthesia Other Findings   Reproductive/Obstetrics                           Anesthesia Physical Anesthesia Plan  ASA: III  Anesthesia Plan: General   Post-op Pain Management:    Induction: Intravenous  Airway Management Planned: Oral ETT  Additional Equipment:   Intra-op Plan:   Post-operative Plan: Extubation in OR  Informed Consent: I have reviewed the patients History and Physical, chart, labs and discussed the procedure including the risks, benefits and alternatives for the proposed anesthesia with the patient or authorized representative who has indicated his/her understanding and acceptance.   Dental advisory given  Plan Discussed with: CRNA  Anesthesia Plan Comments:         Anesthesia Quick Evaluation

## 2013-04-14 NOTE — Progress Notes (Signed)
Pt already on CPAP at this time, machine is working properly and Pt is resting comfortably.  RT to monitor and assess as needed.

## 2013-04-14 NOTE — Op Note (Signed)
Name:  Melissa Cherry MRN: [id number] Date of Surgery: 04/14/2013  Preop Diagnosis:  Morbid Obesity, S/P RYGB  Postop Diagnosis:  Morbid Obesity, S/P RYGB (BMI 51.6)  Procedure:  Upper endoscopy  (Intraoperative)  Surgeon:  Ovidio Kin, M.D.  Anesthesia:  GET  Indications for procedure: Melissa Cherry is a 52 y.o. female whose primary care physician is MALONEY,NANCY, MD and has completed a Roux-en-Y gastric bypass today by Dr. [name].  I am doing an intraoperative upper endoscopy to evaluate the gastric pouch and the gastro-jejunal anastomosis.  Operative Note: The patient is under general anesthesia.  Dr. [name] is laparoscoping the patient while I do an upper endoscopy to evaluate the stomach pouch and gastrojejunal anastomosis.  With the patient intubated, I passed the Olympus endoscope without difficulty down the esophagus.  The esophago-gastric junction was at 39 cm.  The gastro-jejunal anastomosis was at 42 cm.  The mucosa of the stomach looked viable and the staple line was intact without bleeding.  The gastro-jejunal anastomosis looked okay.  While I insufflated the stomach pouch with air, Dr. [name] clamped off the efferent limb of the jejunum.  He then flooded the upper abdomen with saline to put the gastric pouch and gastro-jejunal anastomosis under saline.  There was no bubbling or evidence of a leak.  Photos were taken of the anastomosis.  The scope was then withdrawn.  The esophagus was unremarkable and the patient tolerated the endoscopy without difficulty.  Ovidio Kin, MD, [facility] Surgery Pager: [phone] Office phone:  [phone]

## 2013-04-14 NOTE — Interval H&P Note (Signed)
History and Physical Interval Note:  04/14/2013 9:03 AM  Melissa Cherry  has presented today for surgery, with the diagnosis of morbid obesity  The various methods of treatment have been discussed with the patient and family. After consideration of risks, benefits and other options for treatment, the patient has consented to  Procedure(s): LAPAROSCOPIC ROUX-EN-Y GASTRIC BYPASS WITH UPPER ENDOSCOPY (N/A) as a surgical intervention .  The patient's history has been reviewed, patient examined, no change in status, stable for surgery.  I have reviewed the patient's chart and labs.  Questions were answered to the patient's satisfaction.    [name]. [name], MD, FACS General, Bariatric, & Minimally Invasive Surgery [facility] Surgery, [place]   [facility] M

## 2013-04-14 NOTE — H&P (View-Only) (Signed)
Patient ID: Melissa Cherry, female   DOB: 05/12/1961, 52 y.o.   MRN: [id number]  Chief complaint: Here for preop visit  HPI Melissa Cherry is a 52 y.o. female.   HPI 52-year-old morbidly obese Caucasian female comes in today for a preoperative appointment. She is scheduled to undergo a laparoscopic Roux-en-Y gastric bypass on July 14. She has started her preoperative diet. She is little bit nervous about the upcoming surgery. She states that she was prescribed Synthroid recently for borderline high TSH. She also states that she developed a cough and a cold about 3 days ago. She denies any fevers or chills. She has had some sinus drainage. She states the drainage is clear. Past Medical History  Diagnosis Date  . Hypercholesteremia   . OSA (obstructive sleep apnea)   . Morbid obesity     Past Surgical History  Procedure Laterality Date  . Tonsillectomy  as child  . Ganglion cyst excision Right 1992  . Cesarean section  2000, 2003  . Dilation and curettage of uterus  1999    Family History  Problem Relation Age of Onset  . Diabetes Father   . Breast cancer Mother     Social History History  Substance Use Topics  . Smoking status: Former Smoker    Quit date: 10/02/1996  . Smokeless tobacco: Not on file  . Alcohol Use: Yes     Comment: Occasionally; 2-3 beers/week at most (during summer)    Allergies  Allergen Reactions  . Tamiflu (Oseltamivir Phosphate) Rash    Current Outpatient Prescriptions  Medication Sig Dispense Refill  . loratadine (CLARITIN) 10 MG tablet Take 10 mg by mouth daily.      . peg 3350 powder (MOVIPREP) 100 G SOLR Take 1 kit (100 g total) by mouth once.  1 kit  0   No current facility-administered medications for this visit.    Review of Systems Review of Systems  Constitutional: Negative for fever, chills and unexpected weight change.  HENT: Positive for congestion and postnasal drip. Negative for hearing loss, sore throat, trouble swallowing, voice  change and sinus pressure.   Eyes: Negative for visual disturbance.  Respiratory: Positive for cough. Negative for wheezing.        Uses CPAP  Cardiovascular: Negative for chest pain, palpitations and leg swelling.       Some dyspnea on exertion. Denies orthopnea, paroxysmal nocturnal dyspnea  Gastrointestinal: Negative for nausea, vomiting, abdominal pain, diarrhea, constipation, blood in stool, abdominal distention and anal bleeding.  Genitourinary: Negative for hematuria, vaginal bleeding and difficulty urinating.       Has some urinary incontinence on occasion with coughing/sneezing  Musculoskeletal: Negative for arthralgias.  Skin: Negative for rash and wound.  Neurological: Negative for seizures, syncope and headaches.       Denies TIAs and amaurosis fugax  Hematological: Negative for adenopathy. Does not bruise/bleed easily.  Psychiatric/Behavioral: Negative for confusion.    Blood pressure 124/84, pulse 86, resp. rate 16, height 5' 1.5" (1.562 m), weight 277 lb 9.6 oz (125.919 kg).  Physical Exam Physical Exam  Vitals reviewed. Constitutional: She is oriented to person, place, and time. She appears well-developed and well-nourished. No distress.  Morbidly obese  HENT:  Head: Normocephalic and atraumatic.  Right Ear: External ear normal.  Left Ear: External ear normal.  Eyes: Conjunctivae and EOM are normal. No scleral icterus.  Neck: No tracheal deviation present. No thyromegaly present.  Cardiovascular: Normal rate, regular rhythm and normal heart sounds.     Pulmonary/Chest: Effort normal and breath sounds normal. No respiratory distress. She has no wheezes.  Abdominal: Soft. She exhibits no distension. There is no tenderness. There is no rebound.    Musculoskeletal: Normal range of motion. She exhibits no edema and no tenderness.  Lymphadenopathy:    She has no cervical adenopathy.  Neurological: She is alert and oriented to person, place, and time.  Skin: Skin is warm  and dry. No rash noted. She is not diaphoretic. No erythema.  Psychiatric: She has a normal mood and affect. Her behavior is normal. Judgment and thought content normal.    Data Reviewed My office note 5/7 TSH elevated at 5.05, nml T4 Chol 279, LDL 210 o/w evaluation labs ok UGI- small sliding HH abd u/s - fatty liver  Assessment    Morbid obesity BMI 51.6 OSA on CPAP Hypercholesterolemia Hypothyroidism Fatty liver      Plan    We reviewed her preoperative workup. We discussed the importance of her compliance with the preoperative diet. I discussed How important  that she try to lose a few pounds before surgery in order to help shrink her liver.  I encouraged her to start taking her Synthroid as it was prescribed by her primary care physician.  With respect to her cough and cold, I told her to contact her primary care physician if it worsens or if it doesn't improve by Friday of this week. If it persists we may have to move back her surgery  She was given a prescription for her bowel prep and given instructions.  All of her questions were asked and answered.       Melissa Cherry M 04/03/2013, 10:26 AM    

## 2013-04-14 NOTE — Progress Notes (Signed)
Utilization review completed.  

## 2013-04-14 NOTE — Anesthesia Procedure Notes (Addendum)
Performed by: Valeda Malm   Procedure Name: Intubation Date/Time: 04/14/2013 9:52 AM Performed by: Valeda Malm Pre-anesthesia Checklist: Patient identified, Emergency Drugs available, Suction available, Patient being monitored and Timeout performed Patient Re-evaluated:Patient Re-evaluated prior to inductionOxygen Delivery Method: Circle system utilized Preoxygenation: Pre-oxygenation with 100% oxygen Intubation Type: IV induction, Combination inhalational/ intravenous induction and Cricoid Pressure applied Ventilation: Mask ventilation without difficulty Laryngoscope Size: 4 Grade View: Grade IV Tube type: Oral Tube size: 7.0 mm Number of attempts: 1 Airway Equipment and Method: Bougie stylet Secured at: 20.5 cm Dental Injury: Teeth and Oropharynx as per pre-operative assessment  Difficulty Due To: Difficulty was anticipated, Difficult Airway- due to large tongue, Difficult Airway- due to reduced neck mobility and Difficult Airway- due to anterior larynx Future Recommendations: Recommend- induction with short-acting agent, and alternative techniques readily available

## 2013-04-14 NOTE — Transfer of Care (Signed)
Immediate Anesthesia Transfer of Care Note  Patient: Melissa Cherry  Procedure(s) Performed: Procedure(s): LAPAROSCOPIC ROUX-EN-Y GASTRIC BYPASS WITH UPPER ENDOSCOPY (N/A)  Patient Location: PACU  Anesthesia Type:General  Level of Consciousness: awake, alert , oriented, sedated and patient cooperative  Airway & Oxygen Therapy: Patient Spontanous Breathing and Patient connected to nasal cannula oxygen  Post-op Assessment: Report given to PACU RN and Post -op Vital signs reviewed and stable  Post vital signs: stable  Complications: No apparent anesthesia complications

## 2013-04-14 NOTE — Op Note (Signed)
Preoperative Diagnosis: Morbid obesity BMI 51  OSA on CPAP  Hypercholesterolemia  Hypothyroidism  Fatty liver  Postoperative diagnosis:  1. same  Surgical procedure: Laparoscopic Roux-en-Y gastric bypass (ante-colic, ante-gastric); upper endoscopy  Surgeon: Atilano Ina, M.D. FACS  Asst.: Ovidio Kin, MD FACS  Anesthesia: General plus 0.25% marcaine with epi  Complications: None   EBL: Minimal   Drains: None   Disposition: PACU in good condition   Indications for procedure: 52yo WF with morbid obesity who has been unsuccessful at sustained weight loss. The patient's comorbidities are listed above. We discussed the risk and benefits of surgery including but not limited to anesthesia risk, bleeding, infection, blood clot formation, anastomotic leak, anastomotic stricture, ulcer formation, death, respiratory complications, intestinal blockage, internal hernia, gallstone formation, vitamin and nutritional deficiencies, injury to surrounding structures, failure to lose weight and mood changes.   Description of procedure: Patient is brought to the operating room and general anesthesia induced. The patient had received preoperative broad-spectrum IV antibiotics and subcutaneous heparin. The abdomen was widely sterilely prepped with Chloraprep and draped. Patient timeout was performed and correct patient and procedure confirmed. Access was obtained with a 12 mm Optiview trocar in the left upper quadrant and pneumoperitoneum established without difficulty. Under direct vision 12 mm trocars were placed laterally in the right upper quadrant, right upper quadrant midclavicular line, and to the left and above the umbilicus for the camera port. A 5 mm trocar was placed laterally in the left upper quadrant.  The omentum was brought into the upper abdomen and the transverse mesocolon elevated and the ligament of Treitz clearly identified. A 40 cm biliopancreatic limb was then carefully measured from  the ligament of Treitz. The small intestine was divided at this point with a single firing of the white load linear stapler. A Penrose drain was sutured to the end of the Roux-en-Y limb for later identification. A 100 cm Roux-en-Y limb was then carefully measured. At this point a side-to-side anastomosis was created between the Roux limb and the end of the biliopancreatic limb. This was accomplished with a single firing of the 45 mm white load linear stapler. The common enterotomy was closed with a running 2-0 Vicryl begun at either end of the enterotomy and tied centrally. It was inspected and a single additional interrupted 2-0 vicryl endostitch was placed. The mesenteric defect was then closed with running 2-0 silk. The omentum was then divided with the harmonic scalpel up towards the transverse colon to allow mobility of the Roux limb toward the gastric pouch. The patient was then placed in steep reversed Trendelenburg. Through a 5 mm subxiphoid site the [facility] retractor was placed and the left lobe of the liver elevated with excellent exposure of the upper stomach and hiatus. The angle of Hiss was then mobilized with the harmonic scalpel. A 4 cm gastric pouch was then carefully measured along the lesser curve of the stomach. Dissection was carried along the lesser curve at this point with the Harmonic scalpel working carefully back toward the lesser sac at right angles to the lesser curve. The free lesser sac was then entered. After being sure all tubes were removed from the stomach an initial firing of the gold load 60 mm linear stapler was fired at right angles across the lesser curve for about 4 cm. The gastric pouch was further mobilized posteriorly and then the pouch was completed with 3 further firings of the 60 mm blue load linear stapler and 1 final firing of a  blue load 45mm linear stapler up through the previously dissected angle of His. It was ensured that the pouch was completely mobilized away  from the gastric remnant. This created a nice tubular 4 cm gastric pouch. The Roux limb was then brought up in an antecolic fashion with the candycane facing to the patient's left without undue tension. The very end of the candycane (roux-limb) appeared a little blue. The mesentery of the roux limb looked ok without hematoma.   The gastrojejunostomy was created with an initial posterior row of 2-0 Vicryl between the Roux limb and the staple line of the gastric pouch. Enterotomies were then made in the gastric pouch and the Roux limb with the harmonic scalpel and at approximately 2-2-1/2 cm anastomosis was created with a single firing of the 45mm blue load linear stapler. The staple line was inspected and was intact without bleeding. The common enterotomy was then closed with running 2-0 Vicryl begun at either end and tied centrally. The Ewall tube was then easily passed through the anastomosis and an outer anterior layer of running 2-0 Vicryl was placed. The Ewald tube was removed. With the outlet of the gastrojejunostomy clamped and under saline irrigation the assistant performed upper endoscopy and with the gastric pouch tensely distended with air-there was no evidence of leak on this test. The pouch was desufflated. The Vonita Moss defect was closed with running 2-0 silk. The last 1cm of the roux limb (candy cane) had pinked up a little bit but I decided to transect this last 1.5cm of the candycane with a firing of a 45mm blue load. This stapled off small bowel was extracted and discarded. The end of the roux limb (candy cane) was viable. The abdomen was inspected for any evidence of bleeding or bowel injury and everything looked fine. The Nathanson retractor was removed under direct vision after coating the anastomosis with Tisseel tissue sealant. All CO2 was evacuated and trochars removed. Skin incisions were closed with 4-0 monocryl and Dermabond. Sponge needle and instrument counts were correct. The patient was  taken to the PACU in good condition.    Melissa Cherry. [name], MD, FACS General, Bariatric, & Minimally Invasive Surgery [facility], [place]

## 2013-04-14 NOTE — Progress Notes (Signed)
Placed on cpap ramp settings with patients home mask and 2 L o2 bled in. Sats are 98%.  Tolerating cpap well

## 2013-04-15 ENCOUNTER — Inpatient Hospital Stay (HOSPITAL_COMMUNITY): Payer: BC Managed Care – PPO

## 2013-04-15 ENCOUNTER — Encounter (HOSPITAL_COMMUNITY): Payer: Self-pay | Admitting: General Surgery

## 2013-04-15 DIAGNOSIS — Z9989 Dependence on other enabling machines and devices: Secondary | ICD-10-CM | POA: Diagnosis present

## 2013-04-15 DIAGNOSIS — K760 Fatty (change of) liver, not elsewhere classified: Secondary | ICD-10-CM | POA: Diagnosis present

## 2013-04-15 DIAGNOSIS — E7800 Pure hypercholesterolemia, unspecified: Secondary | ICD-10-CM | POA: Diagnosis present

## 2013-04-15 LAB — COMPREHENSIVE METABOLIC PANEL
AST: 54 U/L — ABNORMAL HIGH (ref 0–37)
BUN: 5 mg/dL — ABNORMAL LOW (ref 6–23)
CO2: 29 mEq/L (ref 19–32)
Calcium: 9.4 mg/dL (ref 8.4–10.5)
Chloride: 101 mEq/L (ref 96–112)
Creatinine, Ser: 0.62 mg/dL (ref 0.50–1.10)
GFR calc Af Amer: >90 mL/min (ref >90)
GFR calc non Af Amer: >90 mL/min (ref >90)
Glucose, Bld: 148 mg/dL — ABNORMAL HIGH (ref 70–99)
Total Bilirubin: 0.4 mg/dL (ref 0.3–1.2)

## 2013-04-15 LAB — CBC WITH DIFFERENTIAL/PLATELET
Eosinophils Relative: 0 % (ref 0–5)
HCT: 43.4 % (ref 36.0–46.0)
Hemoglobin: 14.5 g/dL (ref 12.0–15.0)
Lymphocytes Relative: 8 % — ABNORMAL LOW (ref 12–46)
Lymphs Abs: 1 10*3/uL (ref 0.7–4.0)
MCV: 89.9 fL (ref 78.0–100.0)
Monocytes Absolute: 0.8 10*3/uL (ref 0.1–1.0)
Monocytes Relative: 6 % (ref 3–12)
Neutro Abs: 11.1 10*3/uL — ABNORMAL HIGH (ref 1.7–7.7)
RDW: 12.8 % (ref 11.5–15.5)
WBC: 12.9 10*3/uL — ABNORMAL HIGH (ref 4.0–10.5)

## 2013-04-15 LAB — HEMOGLOBIN AND HEMATOCRIT, BLOOD
HCT: 41.9 % (ref 36.0–46.0)
Hemoglobin: 13.8 g/dL (ref 12.0–15.0)

## 2013-04-15 MED ORDER — IOHEXOL 300 MG/ML  SOLN
50.0000 mL | Freq: Once | INTRAMUSCULAR | Status: AC | PRN
  Administered 2013-04-15: 50 mL via ORAL

## 2013-04-15 MED ORDER — PROMETHAZINE HCL 25 MG/ML IJ SOLN: 12.5000 mg | Freq: Four times a day (QID) | INTRAMUSCULAR | Status: DC | PRN

## 2013-04-15 NOTE — Plan of Care (Signed)
Problem: Phase I Progression Outcomes Goal: Diet - NPO Outcome: Completed/Met Date Met:  04/15/13 NPO earlier in shift with slow progression following UGI this am.

## 2013-04-15 NOTE — Progress Notes (Signed)
1 Day Post-Op  Subjective: Had some nausea and lower abd pain. Ambulated. +burping. Used CPAP last night  Objective: Vital signs in last 24 hours: Temp:  [96.5 F (35.8 C)-98.8 F (37.1 C)] 98.8 F (37.1 C) (07/15 0925) Pulse Rate:  [64-85] 67 (07/15 0925) Resp:  [9-17] 15 (07/15 0925) BP: (128-165)/(68-91) 128/83 mmHg (07/15 0925) SpO2:  [94 %-100 %] 100 % (07/15 0925) Last BM Date: 04/14/13  Intake/Output from previous day: 07/14 0701 - 07/15 0700 In: 3183.3 [I.V.:3183.3] Out: 3615 [Urine:3475; Blood:100] Intake/Output this shift: Total I/O In: -  Out: 800 [Urine:800]  Pt examined in Radiology  Alert, nad cta ant b/l Reg Obese, soft, ND, appropriate TTP No edema  Lab Results:   Recent Labs  04/14/13 1450 04/15/13 0440  WBC  --  12.9*  HGB 15.2* 14.5  HCT 43.9 43.4  PLT  --  227   BMET  Recent Labs  04/15/13 0440  NA 138  K 4.4  CL 101  CO2 29  GLUCOSE 148*  BUN 5*  CREATININE 0.62  CALCIUM 9.4   PT/INR No results found for this basename: LABPROT, INR,  in the last 72 hours ABG No results found for this basename: PHART, PCO2, PO2, HCO3,  in the last 72 hours  Studies/Results: No results found.  Anti-infectives: Anti-infectives   Start     Dose/Rate Route Frequency Ordered Stop   04/14/13 0739  cefOXitin (MEFOXIN) 2 g in dextrose 5 % 50 mL IVPB     2 g 100 mL/hr over 30 Minutes Intravenous On call to O.R. 04/14/13 0739 04/14/13 0930      Assessment/Plan: s/p Procedure(s): LAPAROSCOPIC ROUX-EN-Y GASTRIC BYPASS WITH UPPER ENDOSCOPY (N/A)  No fever, no tachycardia - looks good VTE prophylaxis - scds, lovenox Ambulate GI - UGI now, if ok will advance to water, ice chips CPAP at night  [name]. [name], MD, FACS General, Bariatric, & Minimally Invasive Surgery [facility], [place]   LOS: 1 day    Melissa Cherry 04/15/2013

## 2013-04-15 NOTE — Progress Notes (Addendum)
Placed pt on cpap for rest.  Autotitration mode, settings adjusted to start at 10cm h2o per pt comfort/request to 20cm h2o.  Pt is wearing 2l [place]/etco2 underneath mask.  Pt is using her full face mask and tubing from home and tolerating well at this time.  Sterile water added to max fill line of humidity chamber.

## 2013-04-15 NOTE — Progress Notes (Signed)
Patient ID: Melissa Cherry, female   DOB: 1960/12/29, 52 y.o.   MRN: [id number] Reviewed UGI - looks good. No evidence of contrast extravasation. Contrast empties into roux limb. Spoke with nurse to start POD 1 diet  [name]. [name], MD, FACS General, Bariatric, & Minimally Invasive Surgery [facility], [place]

## 2013-04-16 LAB — CBC WITH DIFFERENTIAL/PLATELET
Basophils Relative: 0 % (ref 0–1)
Hemoglobin: 13.7 g/dL (ref 12.0–15.0)
Lymphs Abs: 1.8 10*3/uL (ref 0.7–4.0)
Monocytes Relative: 9 % (ref 3–12)
Neutro Abs: 6.1 10*3/uL (ref 1.7–7.7)
Neutrophils Relative %: 70 % (ref 43–77)
RBC: 4.51 MIL/uL (ref 3.87–5.11)

## 2013-04-16 MED ORDER — PANTOPRAZOLE SODIUM 40 MG PO TBEC: 40.0000 mg | DELAYED_RELEASE_TABLET | Freq: Every day | ORAL | Status: DC

## 2013-04-16 MED ORDER — OXYCODONE-ACETAMINOPHEN 5-325 MG/5ML PO SOLN: 5.0000 mL | ORAL | Status: DC | PRN

## 2013-04-16 NOTE — Anesthesia Postprocedure Evaluation (Signed)
Anesthesia Post Note  Patient: Melissa Cherry  Procedure(s) Performed: Procedure(s) (LRB): LAPAROSCOPIC ROUX-EN-Y GASTRIC BYPASS WITH UPPER ENDOSCOPY (N/A)  Anesthesia type: General  Patient location: PACU  Post pain: Pain level controlled  Post assessment: Post-op Vital signs reviewed  Last Vitals:  Filed Vitals:   04/16/13 0616  BP: 140/88  Pulse: 78  Temp: 36.8 C  Resp: 14    Post vital signs: Reviewed  Level of consciousness: sedated  Complications: No apparent anesthesia complications

## 2013-04-16 NOTE — Progress Notes (Signed)
2 Days Post-Op  Subjective: Doing well. Had nausea with water yesterday. Water this am ok. No n/v/reflux. Pain ok  Objective: Vital signs in last 24 hours: Temp:  [97.5 F (36.4 C)-98.8 F (37.1 C)] 98.3 F (36.8 C) (07/16 0616) Pulse Rate:  [63-78] 78 (07/16 0616) Resp:  [13-16] 14 (07/16 0616) BP: (117-159)/(73-88) 140/88 mmHg (07/16 0616) SpO2:  [96 %-100 %] 98 % (07/16 0616) Last BM Date: 04/14/13  Intake/Output from previous day: 07/15 0701 - 07/16 0700 In: 2559.2 [P.O.:180; I.V.:2379.2] Out: 2600 [Urine:2600] Intake/Output this shift:    Alert, nad, smiling cta ant Reg Obese, soft, nt, nd. Incisions c/d/i No edema  Lab Results:   Recent Labs  04/15/13 0440 04/15/13 1553 04/16/13 0454  WBC 12.9*  --  8.7  HGB 14.5 13.8 13.7  HCT 43.4 41.9 40.9  PLT 227  --  187   BMET  Recent Labs  04/15/13 0440  NA 138  K 4.4  CL 101  CO2 29  GLUCOSE 148*  BUN 5*  CREATININE 0.62  CALCIUM 9.4   PT/INR No results found for this basename: LABPROT, INR,  in the last 72 hours ABG No results found for this basename: PHART, PCO2, PO2, HCO3,  in the last 72 hours  Studies/Results: Dg Ugi W/water Sol Cm  04/15/2013   *RADIOLOGY REPORT*  Clinical Data:  Status post laparoscopic Roux-en-Y gastric bypass surgery on 04/14/2013.  UPPER GI SERIES WITH KUB  Technique:  Routine upper GI series was performed with water- soluble contrast.  Fluoroscopy Time: 1 minute and 24 seconds.  Comparison:  02/25/2013  Findings: The patient was given water soluble contrast to swallow in a nearly upright position.  Contrast shows normal transit through the esophagus and gastric remnant and immediate drainage into normally patent efferent jejunum.  There is no evidence of leak.  There is no obstruction.  IMPRESSION: Unremarkable postoperative upper GI series demonstrating normal drainage through the gastric remnant and normally patent efferent jejunum.   Original Report Authenticated By: Irish Lack, M.D.    Anti-infectives: Anti-infectives   Start     Dose/Rate Route Frequency Ordered Stop   04/14/13 0739  cefOXitin (MEFOXIN) 2 g in dextrose 5 % 50 mL IVPB     2 g 100 mL/hr over 30 Minutes Intravenous On call to O.R. 04/14/13 0739 04/14/13 0930      Assessment/Plan: s/p Procedure(s): LAPAROSCOPIC ROUX-EN-Y GASTRIC BYPASS WITH UPPER ENDOSCOPY (N/A)  Doing well Adv to POD 2 diet nml HR, no fever hgb ok  If tolerates diet, d/c later this am Discussed d/c instructions.  [name]. [name], MD, FACS General, Bariatric, & Minimally Invasive Surgery [facility] Surgery, [place]   LOS: 2 days    Melissa Cherry 04/16/2013

## 2013-04-16 NOTE — Progress Notes (Signed)
Discharged from floor via w/c, spouse with pt. No changes in assessment. Melissa Cherry   

## 2013-04-16 NOTE — Progress Notes (Signed)
Patient alert and oriented, pain is controlled. Patient is tolerating fluids, advanced to protein shake today.  Reviewed Gastric Bypass discharge instructions with patient and husband.  Patient is able to articulate understanding.  All questions answered.  Information provided for BELT, Support Group, and [facility] pharmacy offerings for bariatric patients.  GASTRIC BYPASS / SLEEVE  Home Care Instructions  These instructions are to help you care for yourself when you go home.  Call: If you have any problems.   Call [phone] and ask for the surgeon on call   If you need immediate assistance come to the ER at [facility]. Tell the ER staff that you are a new post-op gastric bypass or gastric sleeve patient   Signs and symptoms to report:   Severe vomiting or nausea o If you cannot handle clear liquids for longer than 1 day, call your surgeon    Abdominal pain which does not get better after taking your pain medication   Fever greater than 100.4 F and chills   Heart rate over 100 beats a minute   Trouble breathing   Chest pain    Redness, swelling, drainage, or foul odor at incision (surgical) sites    If your incisions open or pull apart   Swelling or pain in calf (lower leg)   Diarrhea (Loose bowel movements that happen often), frequent watery, uncontrolled bowel movements   Constipation, (no bowel movements for 3 days) if this happens:  o Take Milk of Magnesia, 2 tablespoons by mouth, 3 times a day for 2 days if needed o Stop taking Milk of Magnesia once you have had a bowel movement o Call your doctor if constipation continues Or o Take Miralax  (instead of Milk of Magnesia) following the label instructions o Stop taking Miralax once you have had a bowel movement o Call your doctor if constipation continues   Anything you think is "abnormal for you"   Normal side effects after surgery:   Unable to sleep at night or unable to concentrate   Irritability   Being tearful (crying) or  depressed These are common complaints, possibly related to your anesthesia, stress of surgery and change in lifestyle, that usually go away a few weeks after surgery.  If these feelings continue, call your medical doctor.  Wound Care: You may have surgical glue, steri-strips, or staples over your incisions after surgery   Surgical glue:  Looks like a clear film over your incisions and will wear off a little at a time   Steri-strips : Adhesive strips of tape over your incisions. You may notice a yellowish color on the skin under the steri-strips. This is used to make the   steri-strips stick better. Do not pull the steri-strips off - let them fall off   Staples: Staples may be removed before you leave the hospital o If you go home with staples, call [facility] at for an appointment with your surgeon's nurse to have staples removed 10 days after surgery, [phone]   Showering: You may shower two (2) days after your surgery unless your surgeon tells you differently o Wash gently around incisions with warm soapy water, rinse well, and gently pat dry  o If you have a drain (tube from your incision), you may need someone to hold this while you shower  o No tub baths until staples are removed and incisions are healed     Medications:   Medications should be liquid or crushed if larger than the  size of a dime   Extended release pills (medication that releases a little bit at a time through the day) should not be crushed   Depending on the size and number of medications you take, you may need to space (take a few throughout the day)/change the time you take your medications so that you do not over-fill your pouch (smaller stomach)   Make sure you follow-up with your primary care physician to make medication changes needed during rapid weight loss and life-style changes   If you have diabetes, follow up with the doctor that orders your diabetes medication(s) within one week after surgery and  check your blood sugar regularly.   Do not drive while taking narcotics (pain medications)   Do not take acetaminophen (Tylenol) and Roxicet or Lortab Elixir at the same time since these pain medications contain acetaminophen  Diet:                    First 2 Weeks  You will see the nutritionist about two (2) weeks after your surgery. The nutritionist will increase the types of foods you can eat if you are handling liquids well:   If you have severe vomiting or nausea and cannot handle clear liquids lasting longer than 1 day, call your surgeon  Protein Shake   Drink at least 2 ounces of shake 5-6 times per day   Each serving of protein shakes (usually 8 - 12 ounces) should have a minimum of:  o 15 grams of protein  o And no more than 5 grams of carbohydrate    Goal for protein each day: o Men = 80 grams per day o Women = 60 grams per day   Protein powder may be added to fluids such as non-fat milk or Lactaid milk or Soy milk (limit to 35 grams added protein powder per serving)  Hydration   Slowly increase the amount of water and other clear liquids as tolerated (See Acceptable Fluids)   Slowly increase the amount of protein shake as tolerated     Sip fluids slowly and throughout the day   May use sugar substitutes in small amounts (no more than 6 - 8 packets per day; i.e. Splenda)  Fluid Goal   The first goal is to drink at least 8 ounces of protein shake/drink per day (or as directed by the nutritionist); some examples of protein shakes are [employer], Dillard's, EAS Edge HP, and Unjury. See handout from pre-op Bariatric Education Class: o Slowly increase the amount of protein shake you drink as tolerated o You may find it easier to slowly sip shakes throughout the day o It is important to get your proteins in first   Your fluid goal is to drink 64 - 100 ounces of fluid daily o It may take a few weeks to build up to this   32 oz (or more) should be clear liquids  And    32  oz (or more) should be full liquids (see below for examples)   Liquids should not contain sugar, caffeine, or carbonation  Clear Liquids:   Water or Sugar-free flavored water (i.e. Fruit H2O, Propel)   Decaffeinated coffee or tea (sugar-free)   Crystal Lite, Wyler's Lite, Minute Maid Lite   Sugar-free Jell-O   Bouillon or broth   Sugar-free Popsicle:   *Less than 20 calories each; Limit 1 per day  Full Liquids: Protein Shakes/Drinks + 2 choices per day of other full liquids  Full liquids must be: o No More Than 12 grams of Carbs per serving  o No More Than 3 grams of Fat per serving   Strained low-fat cream soup   Non-Fat milk   Fat-free Lactaid Milk   Sugar-free yogurt (Dannon Lite & Fit, Greek yogurt)      Vitamins and Minerals   Start 1 day after surgery unless otherwise directed by your surgeon   2 Chewable Multivitamin / Multimineral Supplement with iron (i.e. Centrum for Adults)   Vitamin B-12, 350 - 500 micrograms sub-lingual (place tablet under the tongue) each day   Chewable Calcium Citrate with Vitamin D-3 (Example: 3 Chewable Calcium Plus 600 with Vitamin D-3) o Take 500 mg three (3) times a day for a total of 1500 mg each day o Do not take all 3 doses of calcium at one time as it may cause constipation, and you can only absorb 500 mg  at a time  o Do not mix multivitamins containing iron with calcium supplements; take 2 hours apart o Do not substitute Tums (calcium carbonate) for your calcium   Menstruating women and those at risk for anemia (a blood disease that causes weakness) may need extra iron o Talk with your doctor to see if you need more iron   If you need extra iron: Total daily Iron recommendation (including Vitamins) is 50 to 100 mg Iron/day   Do not stop taking or change any vitamins or minerals until you talk to your nutritionist or surgeon   Your nutritionist and/or surgeon must approve all vitamin and mineral supplements   Activity and Exercise: It  is important to continue walking at home.  Limit your physical activity as instructed by your doctor.  During this time, use these guidelines:   Do not lift anything greater than ten (10) pounds for at least two (2) weeks   Do not go back to work or drive until Designer, industrial/product says you can   You may have sex when you feel comfortable  o It is VERY important for female patients to use a reliable birth control method; fertility often increases after surgery  o Do not get pregnant for at least 18 months   Start exercising as soon as your doctor tells you that you can o Make sure your doctor approves any physical activity   Start with a simple walking program   Walk 5-15 minutes each day, 7 days per week.    Slowly increase until you are walking 30-45 minutes per day Consider joining our BELT program. [phone] or email [email or web address]   Special Instructions Things to remember:   Free counseling is available for you and your family through collaboration between [facility] and [place]. Please call [phone] and leave a message   Use your CPAP when sleeping if this applies to you   [facility] has a free Bariatric Surgery Support Group that meets monthly, the 3rd Thursday, 6 pm, [facility] Classrooms You can see classes online at [email or web address]   It is very important to keep all follow up appointments with your surgeon, nutritionist, primary care physician, and behavioral health practitioner o After the first year, please follow up with your bariatric surgeon and nutritionist at least once a year in order to maintain best weight loss results [facility]: [phone] [facility]: [phone] Bariatric Nurse Coordinator: [phone]

## 2013-04-17 NOTE — Progress Notes (Signed)
Utilization review completed.  

## 2013-04-18 NOTE — Discharge Summary (Signed)
Physician Discharge Summary  EVELYNNE SPIERS ZOX:[id number] DOB: 12-05-1960 DOA: 04/14/2013  PCP: Lorie Phenix, MD  Admit date: 04/14/2013 Discharge date: 04/16/13  Recommendations for Outpatient Follow-up:  1.   Follow-up Information   Follow up with Atilano Ina, MD On 05/02/2013. (8:45 AM)    Contact information:   [address] [place] [address] [phone]      Discharge Diagnoses:  Patient Active Problem List   Diagnosis Date Noted  . OSA on CPAP 04/15/2013  . Hypercholesteremia 04/15/2013  . Fatty liver 04/15/2013  . Morbid obesity with BMI of 50.0-59.9, adult 02/05/2013   Surgical Procedure: Laparoscopic Roux-en-Y gastric bypass, upper endoscopy 04/14/2013  Discharge Condition: Good Disposition: Home  Diet recommendation: Postop gastric bypass diet-liquids  Filed Weights   04/14/13 0739  Weight: 269 lb 8 oz (122.244 kg)    Hospital Course:  52 year old morbidly obese Caucasian female was admitted for a planned laparoscopic Roux-en-Y gastric bypass ( antecolic, anti-gastric). The procedure was uneventful. She was maintained on perioperative chemical DVT prophylaxis along with sequential compression devices. She was also placed on CPAP postoperatively. On the morning of postoperative day 1 she underwent an upper GI which demonstrated no extravasation of contrast. She was started on ice chips and water. She did have a little bit of nausea. Her vital signs remained stable. On postoperative day 2 she was advanced to protein shakes which he tolerated. On the day of discharge she was ambulating without difficulty. Her vital signs are stable without any tachycardia or fever. She was tolerating her shakes. Her incisions were clean, dry and intact. She received her discharge instructions.   Discharge Instructions   Future Appointments Provider Department Dept Phone   04/29/2013 4:00 PM [facility] Post-Op Class [facility]  [phone]   05/02/2013 11:45 AM Atilano Ina, MD [facility], [place] [phone]       Medication List         dextromethorphan-guaiFENesin 30-600 MG per 12 hr tablet  Commonly known as:  MUCINEX DM  Take 1 tablet by mouth 2 (two) times daily as needed (for congestion).     levothyroxine 25 MCG tablet  Commonly known as:  SYNTHROID, LEVOTHROID  Take 25 mcg by mouth daily before breakfast.     loratadine 10 MG tablet  Commonly known as:  CLARITIN  Take 10 mg by mouth daily as needed for allergies.     oxyCODONE-acetaminophen 5-325 MG/5ML solution  Commonly known as:  ROXICET  Take 5-10 mLs by mouth every 4 (four) hours as needed.     pantoprazole 40 MG tablet  Commonly known as:  PROTONIX  Take 1 tablet (40 mg total) by mouth daily.           Follow-up Information   Follow up with Atilano Ina, MD On 05/02/2013. (8:45 AM)    Contact information:   [address] [place] [address] [phone]        The results of significant diagnostics from this hospitalization (including imaging, microbiology, ancillary and laboratory) are listed below for reference.    Significant Diagnostic Studies: Dg Ugi W/water Sol Cm  04/15/2013   *RADIOLOGY REPORT*  Clinical Data:  Status post laparoscopic Roux-en-Y gastric bypass surgery on 04/14/2013.  UPPER GI SERIES WITH KUB  Technique:  Routine upper GI series was performed with water- soluble contrast.  Fluoroscopy Time: 1 minute and 24 seconds.  Comparison:  02/25/2013  Findings: The patient was given  water soluble contrast to swallow in a nearly upright position.  Contrast shows normal transit through the esophagus and gastric remnant and immediate drainage into normally patent efferent jejunum.  There is no evidence of leak.  There is no obstruction.  IMPRESSION: Unremarkable postoperative upper GI series demonstrating normal drainage through the gastric remnant and normally patent efferent jejunum.   Original  Report Authenticated By: Irish Lack, M.D.    Microbiology: No results found for this or any previous visit (from the past 240 hour(s)).   Labs: Basic Metabolic Panel:  Recent Labs Lab 04/15/13 0440  NA 138  K 4.4  CL 101  CO2 29  GLUCOSE 148*  BUN 5*  CREATININE 0.62  CALCIUM 9.4   Liver Function Tests:  Recent Labs Lab 04/15/13 0440  AST 54*  ALT 59*  ALKPHOS 90  BILITOT 0.4  PROT 7.3  ALBUMIN 3.7   CBC:  Recent Labs Lab 04/14/13 1450 04/15/13 0440 04/15/13 1553 04/16/13 0454  WBC  --  12.9*  --  8.7  NEUTROABS  --  11.1*  --  6.1  HGB 15.2* 14.5 13.8 13.7  HCT 43.9 43.4 41.9 40.9  MCV  --  89.9  --  90.7  PLT  --  227  --  187   Active Problems:   Morbid obesity with BMI of 50.0-59.9, adult   OSA on CPAP   Hypercholesteremia   Fatty liver   Time coordinating discharge: 10  minutes  Signed:  Atilano Ina, MD [facility], [place] [phone] 04/18/2013, 4:11 PM

## 2013-04-29 ENCOUNTER — Encounter: Payer: BC Managed Care – PPO | Admitting: *Deleted

## 2013-04-29 VITALS — Ht 61.0 in | Wt 260.0 lb

## 2013-04-29 DIAGNOSIS — Z6841 Body Mass Index (BMI) 40.0 and over, adult: Secondary | ICD-10-CM

## 2013-04-29 NOTE — Progress Notes (Signed)
Bariatric Class:  Appt start time: 1600  End time:  1700.  2 Week Post-Operative Nutrition Class  Patient was seen on 04/29/13 for Post-Operative Nutrition education [facility].   Surgery date: 04/14/13 Surgery type: RYGB Start weight at [facility]: 279.1 lbs (02/26/13)  Weight today: 260.0 lbs Weight change: 13.5 lbs Total weight lost: 19.1 lbs  TANITA  BODY COMP RESULTS  04/10/13 04/29/13   BMI (kg/m^2) 51.7 49.1   Fat Mass (lbs) 141.5 134.0   Fat Free Mass (lbs) 132.0 126.0   Total Body Water (lbs) 96.5 92.0   The following the learning objectives were met by the patient during this course:  Identifies Phase 3A (Soft, High Proteins) Dietary Goals and will begin from 2 weeks post-operatively to 2 months post-operatively  Identifies appropriate sources of fluids and proteins   States protein recommendations and appropriate sources post-operatively  Identifies the need for appropriate texture modifications, mastication, and bite sizes when consuming solids  Identifies appropriate multivitamin and calcium sources post-operatively  Describes the need for physical activity post-operatively and will follow MD recommendations  States when to call healthcare provider regarding medication questions or post-operative complications  Handouts given during class include:  Phase 3A: Soft, High Protein Diet Handout  Follow-Up Plan: Patient will follow-up at [facility] in 6 weeks for 2 month post-op nutrition visit for diet advancement per MD.

## 2013-04-30 ENCOUNTER — Encounter: Payer: Self-pay | Admitting: *Deleted

## 2013-04-30 NOTE — Patient Instructions (Signed)
Patient to follow Phase 3A-Soft, High Protein Diet and follow-up at [facility] in 6 weeks for 2 months post-op nutrition visit for diet advancement. 

## 2013-05-02 ENCOUNTER — Encounter (INDEPENDENT_AMBULATORY_CARE_PROVIDER_SITE_OTHER): Payer: Self-pay | Admitting: General Surgery

## 2013-05-02 ENCOUNTER — Ambulatory Visit (INDEPENDENT_AMBULATORY_CARE_PROVIDER_SITE_OTHER): Payer: BC Managed Care – PPO | Admitting: General Surgery

## 2013-05-02 VITALS — BP 124/80 | HR 68 | Temp 97.8°F | Resp 15 | Ht 61.0 in | Wt 257.6 lb

## 2013-05-02 DIAGNOSIS — Z9884 Bariatric surgery status: Secondary | ICD-10-CM | POA: Insufficient documentation

## 2013-05-02 NOTE — Progress Notes (Signed)
Subjective:     Patient ID: Melissa Cherry, female   DOB: Jan 27, 1961, 52 y.o.   MRN: [id number]  HPI 52 year old Caucasian female comes in for followup after undergoing laparoscopic gastric bypass on 04/14/2013. She was discharged from the hospital July 16. She states that she has been doing great. She denies any fever, chills, vomiting or regurgitation. She has had some mild abdominal pain since trying solid proteins since Wednesday. Her diet was advanced by the nutritionist this past Wednesday. She states that she tried some fish last night and it felt like it sat in her upper abdomen for a while and caused some gas pain. She denies any melena or hematochezia. She denies any reflux. She is using her CPAP machine. She is taking her supplements as directed. She states that her energy level is slowly improving. She denies any dysuria. She did have some issues with constipation but now is reporting one bowel movement a day. She does feel restriction with foods.  Review of Systems     Objective:   Physical Exam BP 124/80  Pulse 68  Temp(Src) 97.8 F (36.6 C) (Temporal)  Resp 15  Ht 5\' 1"  (1.549 m)  Wt 257 lb 9.6 oz (116.847 kg)  BMI 48.7 kg/m2  Gen: alert, NAD, non-toxic appearing Pupils: equal, no scleral icterus Pulm: Lungs clear to auscultation, symmetric chest rise CV: regular rate and rhythm Abd: soft, nontender, nondistended. Well-healed trocar sites - has spit a monocryl skin stitch near her umbilicus. No cellulitis. No incisional hernia Ext: no edema, no calf tenderness Skin: no rash, no jaundice     Assessment:     Status post laparoscopic Roux-en-Y gastric bypass Hypercholesterolemia Obstructive sleep apnea on CPAP Hypothyroidism Fatty liver     Plan:     Overall she is doing well. Total weight loss 20 pounds. I told her it is very common for patients to have some trouble with solid foods once they start this portion of their diet phase. She was instructed to continue  taking her Protonix for the next several months. We discussed proper eating techniques and behaviors such as taking one small bite at a time, chewing very thoroughly, waiting 20-30 seconds between bites of food, and taking 20-30 minutes to eat. a meal. We discussed the importance of staying hydrated. We also discussed the importance of Taking the supplements.  We also discussed the importance of daily routine exercise. We discussed walking. I also encouraged her to consider getting a pedometer and tracking her step count and then making weekly goals to increase her step count  F/u 4 weeks.   [name]. [name], MD, FACS General, Bariatric, & Minimally Invasive Surgery [facility], [place]

## 2013-05-02 NOTE — Patient Instructions (Signed)
Eating techniques 20-20-20 (30-30-30) 20 chews, 20 seconds between bites of food, 20 minutes to eat; sometimes you may need 30 chews, 30 seconds etc Use your nondominant hand to eat with Use a child/infant size utensil Try not to eat while watching TV  Exercise daily - consider getting a pedometer and tracking your steps and setting goals

## 2013-06-05 ENCOUNTER — Ambulatory Visit (INDEPENDENT_AMBULATORY_CARE_PROVIDER_SITE_OTHER): Payer: BC Managed Care – PPO | Admitting: General Surgery

## 2013-06-05 ENCOUNTER — Encounter (INDEPENDENT_AMBULATORY_CARE_PROVIDER_SITE_OTHER): Payer: Self-pay | Admitting: General Surgery

## 2013-06-05 VITALS — BP 122/80 | HR 78 | Temp 98.4°F | Resp 14 | Ht 61.0 in | Wt 244.4 lb

## 2013-06-05 DIAGNOSIS — Z09 Encounter for follow-up examination after completed treatment for conditions other than malignant neoplasm: Secondary | ICD-10-CM

## 2013-06-05 MED ORDER — PANTOPRAZOLE SODIUM 40 MG PO TBEC: 40.0000 mg | DELAYED_RELEASE_TABLET | Freq: Every day | ORAL | Status: DC

## 2013-06-05 NOTE — Progress Notes (Signed)
Subjective:     Patient ID: Melissa Cherry, female   DOB: Jul 26, 1961, 52 y.o.   MRN: [id number]  HPI 52 year old Caucasian female comes in for followup after undergoing laparoscopic Roux-en-Y gastric bypass on July 14. I last saw her in the office on August 1. At that time her weight was 257 pounds. Since then she states that she's been doing pretty good. She denies any significant abdominal pain. She has some mild occasional nausea. She also has some mild intermittent heartburn. She is still taking Protonix as prescribed. This is for prophylaxis. She states that she still has some occasional problems with fish & chicken. She thinks that she is not chewing thoroughly enough. She states that she has an easier time eating a poached egg versus a boiled egg. She states that she is taking her supplements. She is walking several times a week while her kids are at soccer practice. She is walking up to a mile at a time. She had one mild episode of left upper quadrant gas pain but it was only transient. She has only had to take a laxative one time since her last visit. She reports more energy. She denies any melena or hematochezia.  Review of Systems     Objective:   Physical Exam BP 122/80  Pulse 78  Temp(Src) 98.4 F (36.9 C) (Temporal)  Resp 14  Ht 5\' 1"  (1.549 m)  Wt 244 lb 6.4 oz (110.859 kg)  BMI 46.2 kg/m2  Gen: alert, NAD, non-toxic appearing Pupils: equal, no scleral icterus Pulm: Lungs clear to auscultation, symmetric chest rise CV: regular rate and rhythm Abd: soft, nontender, nondistended. Well-healed trocar sites. No cellulitis. No incisional hernia Ext: no edema, no calf tenderness Skin: no rash, no jaundice     Assessment:     Status post laparoscopic Roux-en-Y gastric bypass Obstructive sleep apnea on CPAP Hypercholesterolemia     Plan:     Overall I think she is doing well. Her total weight loss today has been 33.2 pounds. She has lost 13 pounds over the past month. I  congratulated her her weight loss. I encouraged her to continue with her exercise. We discussed ways to incorporate interval walking which would burn more calories. I encouraged her to discuss food choices with the nutritionist next week. We discussed proper eating behaviors such as chewing very thoroughly, waiting 20-30 seconds between bites of food, as well as taking 20-30 minutes evening meal. We discussed that her food needs to be moist. I encouraged her to continue with her supplements on a daily basis. She was given a prescription to refill her protonix. Followup 3 months  [name]. [name], MD, FACS General, Bariatric, & Minimally Invasive Surgery [facility], [place]

## 2013-06-05 NOTE — Patient Instructions (Signed)
Eating techniques 20-20-20 (30-30-30) 20 chews, 20 seconds between bites of food, 20 minutes to eat; sometimes you may need 30 chews, 30 seconds etc Use your nondominant hand to eat with Use a child/infant size utensil Try not to eat while watching TV  Try incorporating interval power walking like we discussed and increase exercise frequency

## 2013-06-11 ENCOUNTER — Encounter: Payer: BC Managed Care – PPO | Attending: General Surgery | Admitting: *Deleted

## 2013-06-11 ENCOUNTER — Encounter: Payer: Self-pay | Admitting: *Deleted

## 2013-06-11 VITALS — Ht 61.0 in | Wt 243.5 lb

## 2013-06-11 DIAGNOSIS — Z713 Dietary counseling and surveillance: Secondary | ICD-10-CM | POA: Insufficient documentation

## 2013-06-11 DIAGNOSIS — Z01818 Encounter for other preprocedural examination: Secondary | ICD-10-CM | POA: Insufficient documentation

## 2013-06-11 NOTE — Progress Notes (Addendum)
  Follow-up visit:  8 Weeks Post-Operative RYGB Surgery  Medical Nutrition Therapy:  Appt start time:  1000   End time:  1045.  Primary concerns today: Post-operative Bariatric Surgery Nutrition Management. Doing very well with no complaints.  Low fluid intake noted and discussed.    Surgery date: 04/14/13 Surgery type: RYGB Start weight at [facility]: 279.1 lbs (02/26/13)  Weight today: 243.5 lbs Weight change: 16.5 lbs Total weight lost: 35.6 lbs  TANITA  BODY COMP RESULTS  04/10/13 04/29/13 06/11/13   BMI (kg/m^2) 51.7 49.1 46.0   Fat Mass (lbs) 141.5 134.0 120.5   Fat Free Mass (lbs) 132.0 126.0 123.0   Total Body Water (lbs) 96.5 92.0 90.0   24-hr recall: B (AM): 1 poached egg OR 5 oz greek yogurt (Dannon L&F or Yoplait) = 6-13g  (*Occasionally has 17g protein shake) Snk (AM): NONE or string cheese = 6g L (PM): 3 oz grilled chicken nuggets or tuna = 20g Snk (PM): NONE or SF jello/pudding/popsicle  D (PM): 3 oz fish or chicken, squash = 20g Snk (PM): NONE  Fluid intake:  ~35-40 oz. Discussed increasing Estimated total protein intake:  50-65g  Medications: See medication list. Out of protonix. Pharmacy said they didn't have her Rx. Will ask them again or contact Dr. [name] Supplementation:  Taking MVI, sublingual B12; Only 1-2 doses of 250 mg Celebrate calcium citrate chews. Did not realize the dosage in them.  Using straws: Sometimes; No gas reported Drinking while eating: No Hair loss: No Carbonated beverages: No N/V/D/C: No Dumping syndrome: None  Recent physical activity:  Walks 3-4 days/week for 20-30 min. Working on increasing.  Progress Towards Goal(s):  In progress.  Handouts given during visit include:  Phase 3B: High Protein + Non-Starchy Vegetables  Hy - Tracking Hydration card (phone app)  Samples given during visit include:   PB2: 1 pkt Lot: none;  Exp: 10/14/13   Nutritional Diagnosis:  -3.3 Overweight/obesity related to past poor dietary habits and  physical inactivity as evidenced by patient w/ recent RYGB surgery following dietary guidelines for continued weight loss.    Intervention:  Nutrition education/diet advancement.  Monitoring/Evaluation:  Dietary intake, exercise, and body weight. Follow up in 1 month for 3 month post-op visit.

## 2013-06-11 NOTE — Patient Instructions (Addendum)
Goals:  Follow Phase 3B: High Protein + Non-Starchy Vegetables  Eat 3-6 small meals/snacks, every 3-5 hrs  Increase lean protein foods to meet 60-80g goal  Increase fluid intake to 64oz +  Avoid drinking 15 minutes before, during and 30 minutes after eating  Aim for >30 min of physical activity daily 

## 2013-06-12 ENCOUNTER — Telehealth (INDEPENDENT_AMBULATORY_CARE_PROVIDER_SITE_OTHER): Payer: Self-pay | Admitting: General Surgery

## 2013-06-12 NOTE — Telephone Encounter (Signed)
Message copied by June Leap on Thu Jun 12, 2013  5:07 PM ------      Message from: [name], ERIC M      Created: Wed Jun 11, 2013 11:50 AM       pls call pt's pharmacy and confirm they got protonix rx on 9/4 with 2 refills. Pt met with nutritionist today and said she didn't/couldn't get protonix from pharmacy. If they have it pls contact pt to let her know it is ready            Thanks      wilson ------

## 2013-06-12 NOTE — Telephone Encounter (Signed)
Called in Protonix 40mg  1 tab po Q daily #30 with 2 additional refills per EW and patient is aware...spoke with Okey Regal (pharmacist) [phone]

## 2013-07-01 ENCOUNTER — Encounter (INDEPENDENT_AMBULATORY_CARE_PROVIDER_SITE_OTHER): Payer: Self-pay

## 2013-07-09 ENCOUNTER — Ambulatory Visit: Payer: BC Managed Care – PPO | Admitting: *Deleted

## 2013-07-17 ENCOUNTER — Ambulatory Visit: Payer: BC Managed Care – PPO | Admitting: *Deleted

## 2013-07-24 ENCOUNTER — Encounter: Payer: Self-pay | Admitting: *Deleted

## 2013-07-24 ENCOUNTER — Encounter: Payer: BC Managed Care – PPO | Attending: General Surgery | Admitting: *Deleted

## 2013-07-24 VITALS — Ht 61.0 in | Wt 227.0 lb

## 2013-07-24 DIAGNOSIS — Z713 Dietary counseling and surveillance: Secondary | ICD-10-CM | POA: Insufficient documentation

## 2013-07-24 DIAGNOSIS — Z01818 Encounter for other preprocedural examination: Secondary | ICD-10-CM | POA: Insufficient documentation

## 2013-07-24 NOTE — Patient Instructions (Addendum)
Goals:  Follow Phase 3B: High Protein + Non-Starchy Vegetables  Eat 3-6 small meals/snacks, every 3-5 hrs  Increase lean protein foods to meet 60-80g goal  Try Quest or similar protein bars [facility] Protein bar,   Increase fluid intake to 64oz +  May add 15 grams of carbohydrate (fruit, whole grain) with meals or snacks  Add protein with ALL carbs  Avoid drinking 15 minutes before, during and 30 minutes after eating  Aim for >30 min of physical activity daily  Consider joining Curves to keep weight loss consistent  NOTE: Consider having thyroid panel drawn by PCP or Dr. [name] if lethargy continues.

## 2013-07-24 NOTE — Progress Notes (Signed)
  Follow-up visit:  14 Weeks Post-Operative RYGB Surgery  Medical Nutrition Therapy:  Appt start time:  920   End time:  950.  Primary concerns today: Post-operative Bariatric Surgery Nutrition Management. Low fluid intake continues, though ramping up. States she is working on it, but forgets to drink. Reports she is very tired all the time and has no energy.   Surgery date: 04/14/13 Surgery type: RYGB Start weight at [facility]: 279.1 lbs (02/26/13)  Weight today: 227.0 lbs Weight change: 16.5 lbs Total weight lost: 52.1 lbs (~ 42 lbs since surgery)  TANITA  BODY COMP RESULTS  04/10/13 04/29/13 06/11/13 07/24/13   BMI (kg/m^2) 51.7 49.1 46.0 42.9   Fat Mass (lbs) 141.5 134.0 120.5 108.5   Fat Free Mass (lbs) 132.0 126.0 123.0 118.5   Total Body Water (lbs) 96.5 92.0 90.0 87.0   24-hr recall: Not obtained d/t time constraints  Fluid intake:  1-2 bottles water (30 oz), 2 (8-10 oz) cups decaf coffee = ~45-50 oz. Still decreased, but up from previous.  Estimated total protein intake:  50-65g; supplements with a shake if believes she is low for the day  Medications: See medication list. Out of protonix; planning to get more asap Supplementation:  Taking MVI, sublingual B12; taking Calctrate chews (carbonate). Discussed Nascobal.   Using straws: Sometimes; No gas reported Drinking while eating:  Sometimes; small sips Hair loss: No Carbonated beverages: No N/V/D/C:  Felt extremely weak/tired and "horrible" with frozen yogurt (SF/FF) - likely sugar ETOH; no problems with greek yogurt Dumping syndrome: None  Recent physical activity:  Walks 3 days/week for 20-30 min. Working on increasing.  Progress Towards Goal(s):  In progress.  Handouts given during visit include:  Meal Planning Card  Nutritional Diagnosis:  [place]-3.3 Overweight/obesity related to past poor dietary habits and physical inactivity as evidenced by patient w/ recent RYGB surgery following dietary guidelines for continued  weight loss.    Intervention:  Nutrition education/diet advancement.  Monitoring/Evaluation:  Dietary intake, exercise, and body weight. Follow up in 3 month for 6 month post-op visit.

## 2013-09-18 ENCOUNTER — Ambulatory Visit (INDEPENDENT_AMBULATORY_CARE_PROVIDER_SITE_OTHER): Payer: BC Managed Care – PPO | Admitting: General Surgery

## 2013-09-18 ENCOUNTER — Encounter (INDEPENDENT_AMBULATORY_CARE_PROVIDER_SITE_OTHER): Payer: Self-pay | Admitting: General Surgery

## 2013-09-18 VITALS — BP 124/86 | HR 64 | Temp 98.8°F | Resp 16 | Ht 61.5 in | Wt 205.6 lb

## 2013-09-18 DIAGNOSIS — E669 Obesity, unspecified: Secondary | ICD-10-CM

## 2013-09-18 DIAGNOSIS — Z9884 Bariatric surgery status: Secondary | ICD-10-CM

## 2013-09-18 NOTE — Progress Notes (Signed)
Subjective:     Patient ID: Melissa Cherry, female   DOB: June 29, 1961, 52 y.o.   MRN: [id number]  HPI 52 year old Caucasian female comes in for long-term followup after undergoing laparoscopic Roux-en-Y gastric bypass on April 14, 2013. She was last seen in the office on September 4. Her weight at that time was 244.6 pounds. She states that she has been doing well. Her main complaint is hair thinning and hair loss. She just started taking biotin supplement. She is unsure how much protein she is averaging a daily basis but thinks it is not enough. She reports that she is taking her supplements as directed. She denies any abdominal pain, reflux, indigestion. She sometimes has problems with constipation. She states that since her daughter's soccer season has ended she has not been exercising on a regular basis. She reports one episode of regurgitation since her last appointment. She also reports some intermittent discomfort behind her left knee. She denies any short of breath. She is using her CPAP intermittently.  PMHx, PSHx, SOCHx, FAMHx, ALL reviewed and unchanged  Review of Systems 12 point ROS performed and negative except for above    Objective:   Physical Exam BP 124/86  Pulse 64  Temp(Src) 98.8 F (37.1 C) (Temporal)  Resp 16  Ht 5' 1.5" (1.562 m)  Wt 205 lb 9.6 oz (93.26 kg)  BMI 38.22 kg/m2  Gen: alert, NAD, non-toxic appearing Pupils: equal, no scleral icterus Pulm: Lungs clear to auscultation, symmetric chest rise CV: regular rate and rhythm Abd: soft, nontender, nondistended. Well-healed trocar sites. No cellulitis. No incisional hernia Ext: no edema, no calf tenderness Skin: no rash, no jaundice     Assessment:     Status post laparoscopic Roux-en-Y gastric bypass Obstructive sleep apnea on CPAP - improved Hypercholesterolemia Hypothyroidism Fatty liver    Plan:     I congratulated her on her weight loss. Total weight loss is around 75 pounds. She has lost 40 pounds  since her last visit in September.  We discussed the importance of daily physical activity. We discussed resuming a walking program or considering a water aerobics program. We discussed using a pedometer and tracking her daily step count and setting weekly goals to increase her step count.   She was reminded to continue with her supplement. I did encourage dieting. We also discussed the fact that she probably is not getting enough protein. I advised her she should strive to get around 60 g a day.  I encouraged her to follow up her primary care physician regarding her CPAP settings. With her averaging around a 75 pound weight loss she may be due for adjustments to her CPAP settings.  With respect to the left posterior knee discomfort I advised her to keep ice on it. If it is persistent she was advised to follow with her primary care physician.  She is due for lab surveillance. She was given an order slip to have her labs checked. Followup 3 months  [name]. [name], MD, FACS General, Bariatric, & Minimally Invasive Surgery [facility], [place]

## 2013-09-18 NOTE — Patient Instructions (Signed)
Continue to take her multivitamins and supplements. Continue to take biotin. You should be trying to get around 60 g of protein a day in your diet Get your lab work performed please  Started back exercising daily. Consider a walking program. Consider getting a pedometer and tracking your daily step count and then setting weekly goals to increase your step count.  Follow with your primary care doctor regarding your CPAP settings

## 2013-09-29 ENCOUNTER — Other Ambulatory Visit (INDEPENDENT_AMBULATORY_CARE_PROVIDER_SITE_OTHER): Payer: Self-pay | Admitting: General Surgery

## 2013-09-29 LAB — CBC WITH DIFFERENTIAL/PLATELET
Hemoglobin: 14.6 g/dL (ref 12.0–15.0)
Lymphocytes Relative: 41 % (ref 12–46)
Lymphs Abs: 2 10*3/uL (ref 0.7–4.0)
MCH: 30.9 pg (ref 26.0–34.0)
Monocytes Relative: 9 % (ref 3–12)
Neutro Abs: 2.2 10*3/uL (ref 1.7–7.7)
Neutrophils Relative %: 44 % (ref 43–77)
RBC: 4.72 MIL/uL (ref 3.87–5.11)
WBC: 4.9 10*3/uL (ref 4.0–10.5)

## 2013-09-29 LAB — FOLATE: Folate: >20 ng/mL

## 2013-09-29 LAB — CMP AND LIVER
AST: 21 U/L (ref 0–37)
Albumin: 4 g/dL (ref 3.5–5.2)
BUN: 10 mg/dL (ref 6–23)
Calcium: 9.6 mg/dL (ref 8.4–10.5)
Chloride: 103 mEq/L (ref 96–112)
Glucose, Bld: 84 mg/dL (ref 70–99)
Potassium: 4.3 mEq/L (ref 3.5–5.3)
Total Protein: 6.2 g/dL (ref 6.0–8.3)

## 2013-09-29 LAB — LIPID PANEL
Cholesterol: 228 mg/dL — ABNORMAL HIGH (ref 0–200)
LDL Cholesterol: 167 mg/dL — ABNORMAL HIGH (ref 0–99)
VLDL: 20 mg/dL (ref 0–40)

## 2013-09-29 LAB — MAGNESIUM: Magnesium: 1.9 mg/dL (ref 1.5–2.5)

## 2013-09-29 LAB — VITAMIN B12: Vitamin B-12: 1609 pg/mL — ABNORMAL HIGH (ref 211–911)

## 2013-09-29 LAB — TSH: TSH: 3.21 u[IU]/mL (ref 0.350–4.500)

## 2013-09-30 LAB — VITAMIN D 25 HYDROXY (VIT D DEFICIENCY, FRACTURES): Vit D, 25-Hydroxy: 51 ng/mL (ref 30–89)

## 2013-10-24 ENCOUNTER — Ambulatory Visit: Payer: BC Managed Care – PPO | Admitting: *Deleted

## 2013-10-29 ENCOUNTER — Encounter: Payer: BC Managed Care – PPO | Admitting: Dietician

## 2013-10-29 ENCOUNTER — Telehealth (INDEPENDENT_AMBULATORY_CARE_PROVIDER_SITE_OTHER): Payer: Self-pay

## 2013-10-29 NOTE — Telephone Encounter (Signed)
Called pt regarding her phone call about nutrition visits. She states that she has to drive a ways and has been 10 min or so late for past 2 appts which have been cancelled bc apparently of being late. She has frustration that she was there for about 20 min before they told her they couldn't see her that day bc she was late. Pt expressed frustration and asked if there was some closer to her in [place] area. Asked pt to give me a few days to look into and apologized for [facility] at [facility].

## 2013-10-29 NOTE — Telephone Encounter (Signed)
Patient called asking if Dr Andrey CampanileWilson has any recommendations for a nutritionist in [place]. She states she has had a lot of issues with getting appointments here with our nutritionist in town and [place] is more convenient for her. Advised we will send him a message and call her back with any recommendations.

## 2013-12-17 ENCOUNTER — Ambulatory Visit (INDEPENDENT_AMBULATORY_CARE_PROVIDER_SITE_OTHER): Payer: BC Managed Care – PPO | Admitting: General Surgery

## 2013-12-17 ENCOUNTER — Encounter (INDEPENDENT_AMBULATORY_CARE_PROVIDER_SITE_OTHER): Payer: Self-pay | Admitting: General Surgery

## 2013-12-17 VITALS — BP 123/76 | HR 72 | Temp 97.7°F | Resp 18 | Ht 61.5 in | Wt 182.4 lb

## 2013-12-17 DIAGNOSIS — E669 Obesity, unspecified: Secondary | ICD-10-CM

## 2013-12-17 DIAGNOSIS — Z9884 Bariatric surgery status: Secondary | ICD-10-CM

## 2013-12-17 NOTE — Patient Instructions (Signed)
Get FASTING labs drawn prior to your appointment in July Look into MyFitnessPal app for smartphone Exercise daily

## 2013-12-18 NOTE — Progress Notes (Signed)
Subjective:     Patient ID: Melissa FitchAlice S Cherry, female   DOB: 1961/06/01, 53 y.o.   MRN: [id number]  HPI [age] year old Caucasian female comes in for long-term followup after undergoing laparoscopic Roux-en-Y gastric bypass on 04/14/2013. I last saw her in the office on 09/18/2013. Her weight at that time was 206 pounds. She states that she is doing well. She denies any nausea, vomiting, abdominal pain. She states her hair loss has started to slow. She denies any Paresthesias. She denies any constipation. She reports more energy. She is still using CPAP but on a lower setting. She is avoiding carbs and trying to limit the amount of sugar in her foods. She reports compliance with her multivitamins and supplements. She admits that she didn't exercise much during the winter. She is now exercising about 3-4 times a week. She is walking on a treadmill and walking around the soccer field. She denies any reflux.  Her surveillance labs in December 2014 were for the most part normal. Her total cholesterol had improved from 279-228. Her LDL cholesterol had improved from 210-167. Her vitamin D level was normal at 51. Her vitamin B 12 level was slightly elevated at 1609.  PMHx, PSHx, SOCHx, FAMHx, ALL reviewed and unchanged  Review of Systems 10 point ROS negative except for HPI    Objective:   Physical Exam BP 123/76  Pulse 72  Temp(Src) 97.7 F (36.5 C) (Temporal)  Resp 18  Ht 5' 1.5" (1.562 m)  Wt 182 lb 6.4 oz (82.736 kg)  BMI 33.91 kg/m2  Gen: alert, NAD, non-toxic appearing Pupils: equal, no scleral icterus Pulm: Lungs clear to auscultation, symmetric chest rise CV: regular rate and rhythm Abd: soft, nontender, nondistended. Well-healed trocar sites. No cellulitis. No incisional hernia Ext: no edema, no calf tenderness Skin: no rash, no jaundice     Assessment:     Status post laparoscopic Roux-en-Y gastric bypass 04/14/2013 Obesity Obstructive sleep apnea on  CPAP-improved Hyperlipidemia-improved Hypothyroidism-stable     Plan:     Overall I think she is doing quite well. Her initial visit we to the office was 280 pounds. Her preoperative weight was 277. Weight loss since initial visit has been 97 pounds. I congratulated her on her weight loss. We discussed the importance of routine frequent exercise for ongoing weight loss as well as prevention of weight regain. She was encouraged to continue her vitamins and supplements. We will recheck her bariatric surveillance labs prior to her one-year appointment in July. We discussed using the MyFitnessPal smart app to track her protein and activity. F/u 4 months with labs  Mary SellaEric M. Andrey CampanileWilson, MD, FACS General, Bariatric, & Minimally Invasive Surgery [facility], [place]

## 2014-01-28 ENCOUNTER — Encounter: Payer: BC Managed Care – PPO | Attending: General Surgery | Admitting: Dietician

## 2014-01-28 VITALS — Ht 61.0 in | Wt 174.0 lb

## 2014-01-28 DIAGNOSIS — R634 Abnormal weight loss: Secondary | ICD-10-CM | POA: Insufficient documentation

## 2014-01-28 DIAGNOSIS — Z713 Dietary counseling and surveillance: Secondary | ICD-10-CM | POA: Insufficient documentation

## 2014-01-28 DIAGNOSIS — Z6832 Body mass index (BMI) 32.0-32.9, adult: Secondary | ICD-10-CM | POA: Insufficient documentation

## 2014-01-28 DIAGNOSIS — Z9884 Bariatric surgery status: Secondary | ICD-10-CM | POA: Insufficient documentation

## 2014-01-28 DIAGNOSIS — E669 Obesity, unspecified: Secondary | ICD-10-CM | POA: Insufficient documentation

## 2014-01-28 NOTE — Progress Notes (Signed)
  Follow-up visit:  9 Months Post-Operative RYGB Surgery  Medical Nutrition Therapy:  Appt start time:  1200   End time:  1230.  Primary concerns today: Post-operative Bariatric Surgery Nutrition Management. Returns with a 53 lb weight loss. Has a lot energy. Had some pain in her abdomen one night, though she thinks it was from food and it hasn't come back. Has been busier with work so she hasn't been able to do her walking, though she expects to continue soon. Fluid intake is improved. Having protein though thinks she may need more vegetables.   Surgery date: 04/14/13 Surgery type: RYGB [facility]: 279.1 lbs (02/26/13) Weight today: 227.0 lbs  Weight change: 53 lbs Total weight lost: 105 lbs  Weight loss goal: 140 lbs % weight loss: 75%  TANITA  BODY COMP RESULTS  04/10/13 04/29/13 06/11/13 07/24/13 01/28/14   BMI (kg/m^2) 51.7 49.1 46.0 42.9 32.9   Fat Mass (lbs) 141.5 134.0 120.5 108.5 64.5   Fat Free Mass (lbs) 132.0 126.0 123.0 118.5 109.5   Total Body Water (lbs) 96.5 92.0 90.0 87.0 80.0   24-hr recall: Not obtained d/t time constraints  Fluid intake:  large unsweet iced tea or 1-2 Propel 1-2 bottles water (30 oz), 2 (8-10 oz) cups decaf coffee = > 64 oz   Estimated total protein intake: chicken, chili, protein bar, almonds, (feels like she's getting her protein) 60g+ Medications: See medication list.  Supplementation:  Taking MVI, sublingual B12; taking calcium chews   Using straws: Sometimes; No gas reported Drinking while eating:  Sometimes; small sips Hair loss: No Carbonated beverages: No N/V/D/C:  Nothing she is worried about Dumping syndrome: None  Recent physical activity:  Walks 3 days/week for 20-30 min and has a treadmill. Working on increasing.  Progress Towards Goal(s):  In progress.  Nutritional Diagnosis:  [place]-3.3 Overweight/obesity related to past poor dietary habits and physical inactivity as evidenced by patient w/ recent RYGB surgery following  dietary guidelines for continued weight loss.    Intervention:  Nutrition education/diet advancement.  Monitoring/Evaluation:  Dietary intake, exercise, and body weight. Follow up in 3 month for 12 month post-op visit.

## 2014-01-28 NOTE — Patient Instructions (Signed)
Goals:  Follow Phase 3B: High Protein + Non-Starchy Vegetables  Eat 3-6 small meals/snacks, every 3-5 hrs  Increase lean protein foods to meet 60-80g goal  Try Quest or similar protein bars [facility] Protein bar,   Increase fluid intake to 64oz +  May add 15 grams of carbohydrate (fruit, whole grain) with meals or snacks  Add protein with ALL carbs  Avoid drinking 15 minutes before, during and 30 minutes after eating  Aim for >30 min of physical activity dailly  Weight change: 53 lbs Total weight lost: 105 lbs  Weight loss goal: 140 lbs % weight loss: 75%  TANITA  BODY COMP RESULTS  04/10/13 04/29/13 06/11/13 07/24/13 01/28/14   BMI (kg/m^2) 51.7 49.1 46.0 42.9 32.9   Fat Mass (lbs) 141.5 134.0 120.5 108.5 64.5   Fat Free Mass (lbs) 132.0 126.0 123.0 118.5 109.5   Total Body Water (lbs) 96.5 92.0 90.0 87.0 80.0

## 2014-04-16 ENCOUNTER — Ambulatory Visit (INDEPENDENT_AMBULATORY_CARE_PROVIDER_SITE_OTHER): Payer: BC Managed Care – PPO | Admitting: General Surgery

## 2014-04-29 ENCOUNTER — Ambulatory Visit: Payer: BC Managed Care – PPO | Admitting: Dietician

## 2014-05-27 ENCOUNTER — Other Ambulatory Visit (INDEPENDENT_AMBULATORY_CARE_PROVIDER_SITE_OTHER): Payer: Self-pay

## 2014-05-27 ENCOUNTER — Encounter (INDEPENDENT_AMBULATORY_CARE_PROVIDER_SITE_OTHER): Payer: Self-pay | Admitting: General Surgery

## 2014-05-27 ENCOUNTER — Ambulatory Visit (INDEPENDENT_AMBULATORY_CARE_PROVIDER_SITE_OTHER): Payer: BC Managed Care – PPO | Admitting: General Surgery

## 2014-05-27 VITALS — BP 126/70 | HR 75 | Ht 61.0 in | Wt 160.0 lb

## 2014-05-27 DIAGNOSIS — E669 Obesity, unspecified: Secondary | ICD-10-CM

## 2014-05-27 DIAGNOSIS — Z9884 Bariatric surgery status: Secondary | ICD-10-CM

## 2014-05-27 NOTE — Patient Instructions (Addendum)
See you in 6 months congrats on weight loss Get labs drawn Follow up with PCP regarding sleep study Start tracking your steps and making weekly goals to increase daily step - goal would be 10,000 steps a day Monitor your food choices Call if you would like cyst removed.  Make appt to follow up with nutritionist

## 2014-05-29 ENCOUNTER — Other Ambulatory Visit (INDEPENDENT_AMBULATORY_CARE_PROVIDER_SITE_OTHER): Payer: Self-pay

## 2014-05-29 NOTE — Progress Notes (Addendum)
Subjective:     Patient ID: Melissa Cherry, female   DOB: 1960-10-08, 53 y.o.   MRN: [id number]  HPI 53 year old Caucasian female comes in for long-term followup after undergoing laparoscopic Roux-en-Y gastric bypass on 04/14/2013. I last saw her in the office on 54/18/15. Her weight at that time was 182 pounds. She states that she is doing well. She denies any nausea, vomiting, abdominal pain. She states her hair loss has started to slow. She denies any Paresthesias. She denies any constipation. She reports more energy. She is no longer using CPAP. She is avoiding carbs and trying to limit the amount of sugar in her foods. She reports compliance with her multivitamins and supplements. She admits that she didn't exercise much during the summer on a regular schedule other than walking and hiking.  She denies any reflux.she does report 2 episodes of epigastric gas pain otherwise no abdominal pain. She feels a recently her food choices haven't been as well balanced. She also complains of a left lower back subcutaneous mass. It has gotten slightly larger. She has had other similar lesions as well. They have been cysts. She also complains of some discomfort around her tailbone since losing all this weight. She denies any paresthesias or numbness or tingling down in extremity.  PMHx, PSHx, SOCHx, FAMHx, ALL reviewed   Review of Systems 10 point ROS negative except for HPI    Objective:   Physical Exam BP 126/70  Pulse 75  Ht  (1.549 m)  Wt 160 lb (72.576 kg)  BMI 30.25 kg/m2  Gen: alert, NAD, non-toxic appearing Pupils: equal, no scleral icterus Pulm: Lungs clear to auscultation, symmetric chest rise CV: regular rate and rhythm Abd: soft, nontender, nondistended. Well-healed trocar sites. No cellulitis. No incisional hernia Ext: no edema, no calf tenderness Skin: no rash, no jaundice; left mid back well circumscribed subcutaneous mass 2 cm x 2 cm, soft, slightly mobile. Nontender. No overlying  skin lesions feels more consistent with a sebaceous cyst      Assessment:     Status post laparoscopic Roux-en-Y gastric bypass 04/14/2013 Obesity Obstructive sleep apnea on CPAP-improved-resolved Hyperlipidemia-improved Hypothyroidism-stable  Mid back subcutaneous mass was consistent with an epidermoid inclusion cyst.    Plan:     Overall I think she is doing quite well. Her initial visit we to the office was 280 pounds. Her preoperative weight was 277. Weight loss since initial visit has been 120 pounds! I congratulated her on her weight loss. We discussed the importance of routine frequent exercise for ongoing weight loss as well as prevention of weight regain. She was encouraged to continue her vitamins and supplements. She is due for bariatric surveillance labs and we will order those. We discussed using the MyFitnessPal smart app to track her protein and activity. I encouraged her to make a followup appointment with the nutritionist for her one-year followup. I also encouraged her to follow up her primary care physician regarding her sleep apnea and her recent complaints of some more fatigue.  With respect to the back mass-it is most consistent with an epidermoid inclusion cyst or lipoma. I favor a cyst. We discussed observation or excision. She is leaning toward excision. We discussed risk and benefits of surgery including but not limited to bleeding, infection, recurrence, hematoma formation, scarring, numbness, anesthesia issues. We discussed excising it under local or under MAC. She is going to think about it for several days and call if back and let us know how she would  like to proceed   Followup with me in 6 months  [name]. [name], MD, FACS General, Bariatric, & Minimally Invasive Surgery [facility], [place]   Note: This dictation was prepared with Dragon/digital dictation along with [facility] technology. Any transcriptional errors that result from this process are  unintentional.

## 2014-07-07 IMAGING — RF DG UGI W/ KUB
15 of 18 series · 15 of 18 positions shown · non-contrast
Comparison: None.

CLINICAL DATA: Bariatric surgery evaluation.

UPPER GI SERIES WITH KUB
TECHNIQUE: Routine upper GI series was performed with thin barium
Fluoroscopy Time: 1-minute-27-seconds

[Series 1: run · 1 of 1 slices shown (1 of 9)]
[im 1/1]
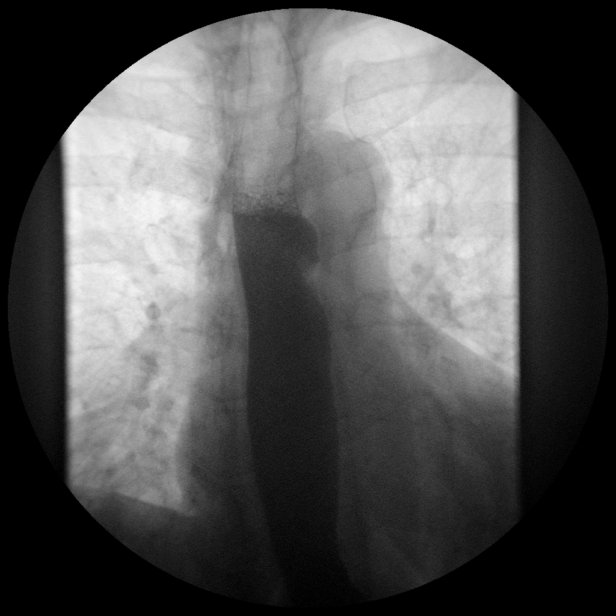

[Series 2: run · 1 of 1 slices shown (2 of 9)]
[im 1/1]
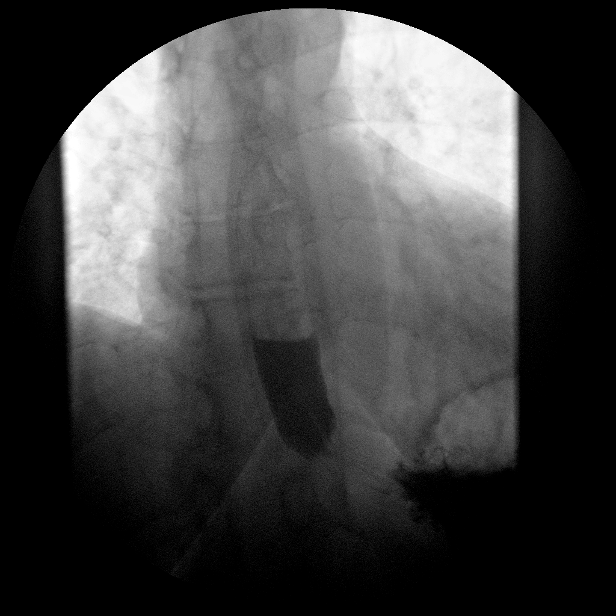

[Series 4: run · 1 of 1 slices shown (3 of 9)]
[im 1/1]
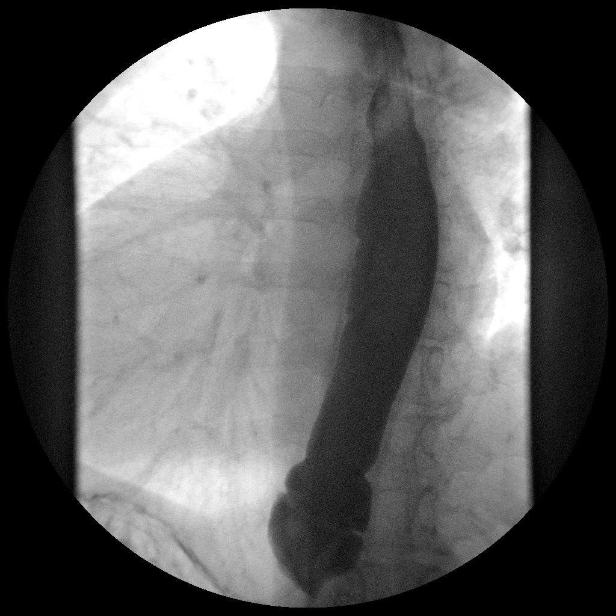

[Series 5: run · 1 of 1 slices shown (4 of 9)]
[im 1/1]
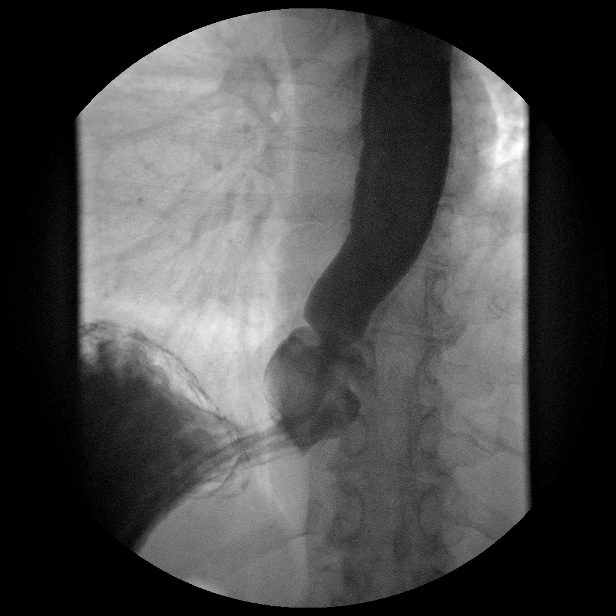

[Series 6: run · 1 of 1 slices shown (5 of 9)]
[im 1/1]
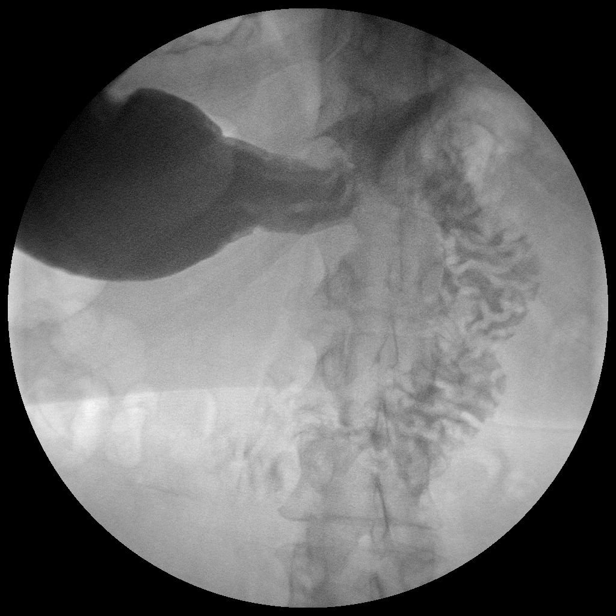

[Series 7: run · 1 of 1 slices shown (6 of 9)]
[im 1/1]
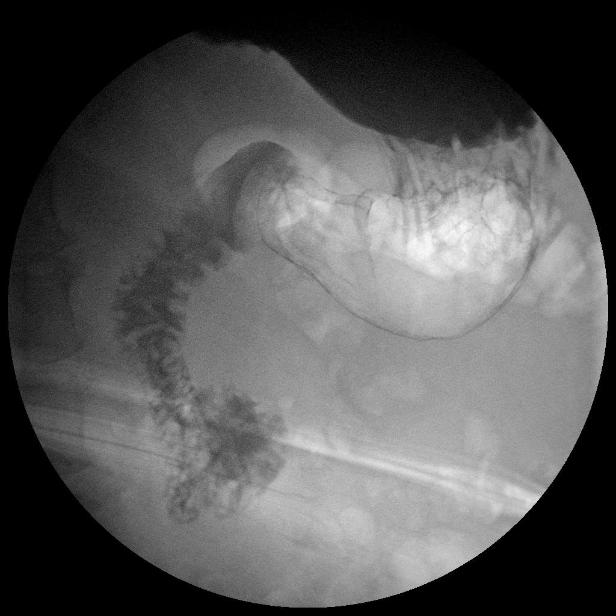

[Series 8: run · 1 of 1 slices shown (7 of 9)]
[im 1/1]
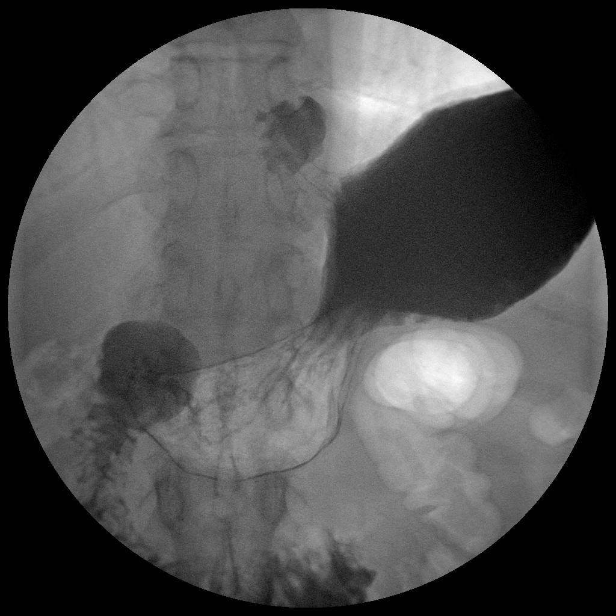

[Series 10: run · 1 of 1 slices shown (8 of 9)]
[im 1/1]
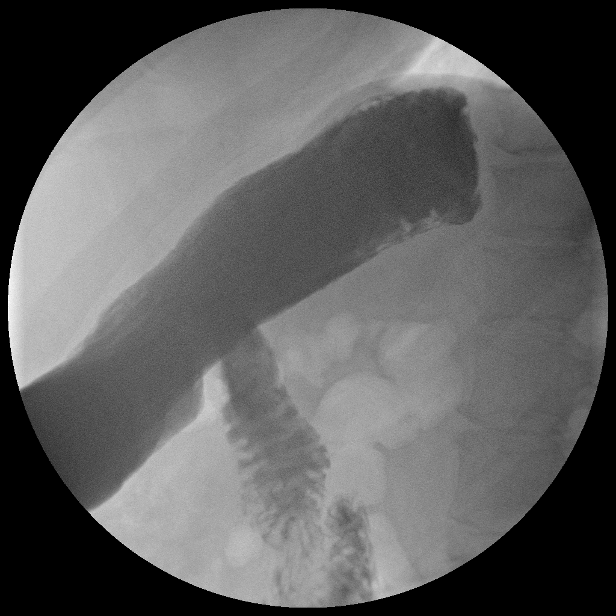

[Series 11: run · 1 of 1 slices shown (9 of 9)]
[im 1/1]
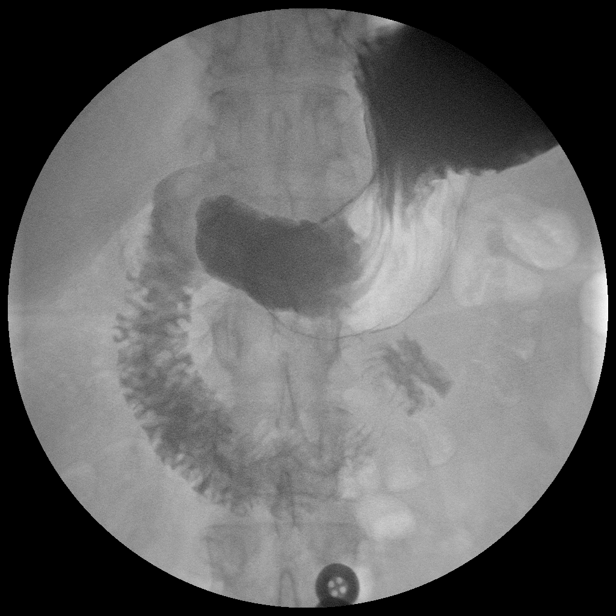

[Series 1001: view not recorded · 0.20mm/px · 1 of 1 slices shown (1 of 6)]
[im 1/1]
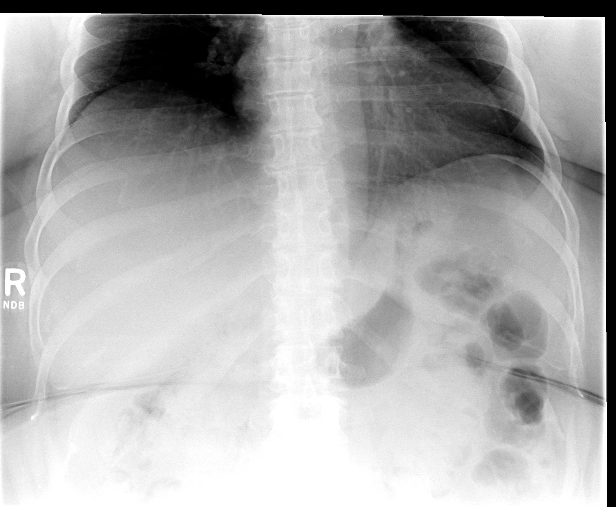

[Series 1002: view not recorded · 0.20mm/px · 1 of 1 slices shown (2 of 6)]
[im 1/1]
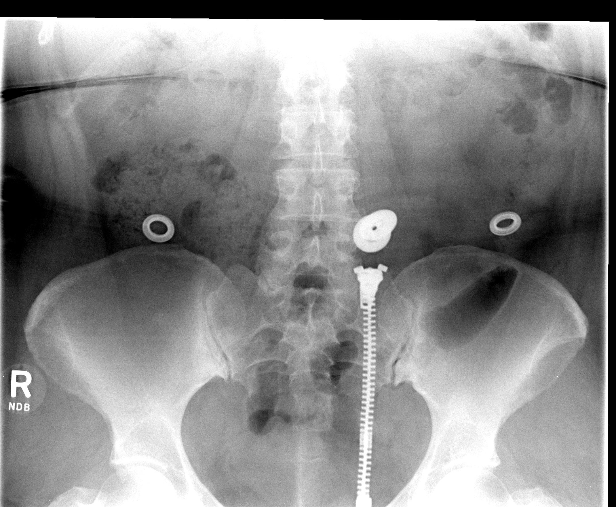

[Series 1003: view not recorded · 0.20mm/px · 1 of 1 slices shown (3 of 6)]
[im 1/1]
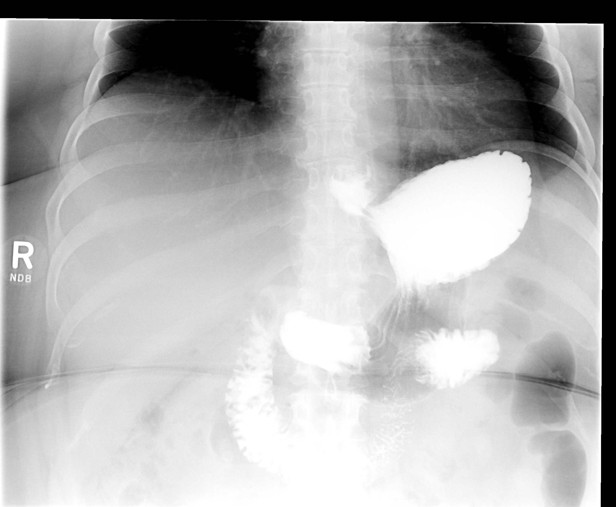

[Series 1005: view not recorded · 0.20mm/px · 1 of 1 slices shown (4 of 6)]
[im 1/1]
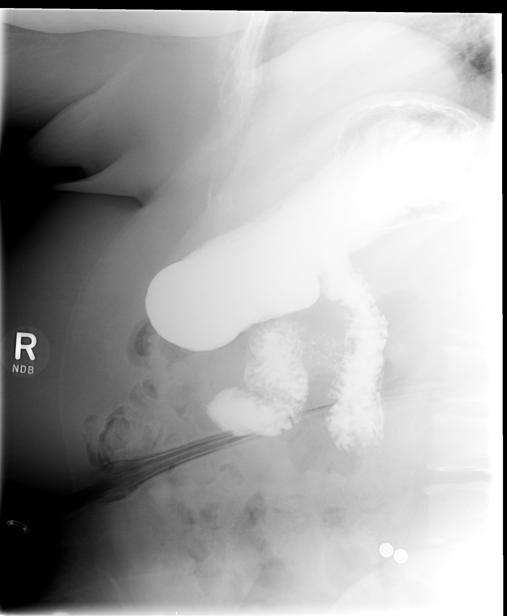

[Series 1006: view not recorded · 0.20mm/px · 1 of 1 slices shown (5 of 6)]
[im 1/1]
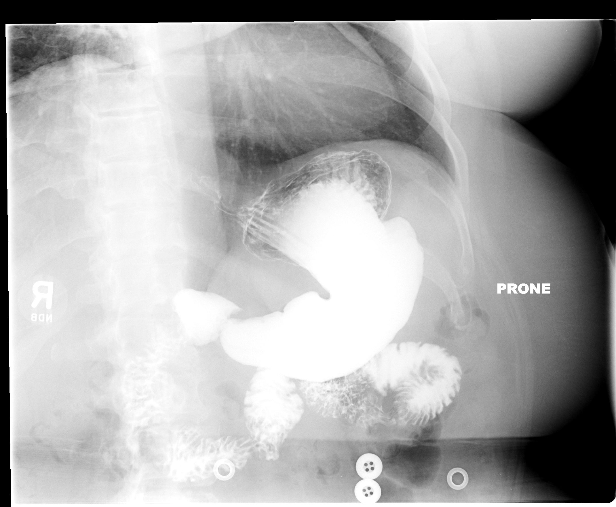

[Series 1007: view not recorded · 0.20mm/px · 1 of 1 slices shown (6 of 6)]
[im 1/1]
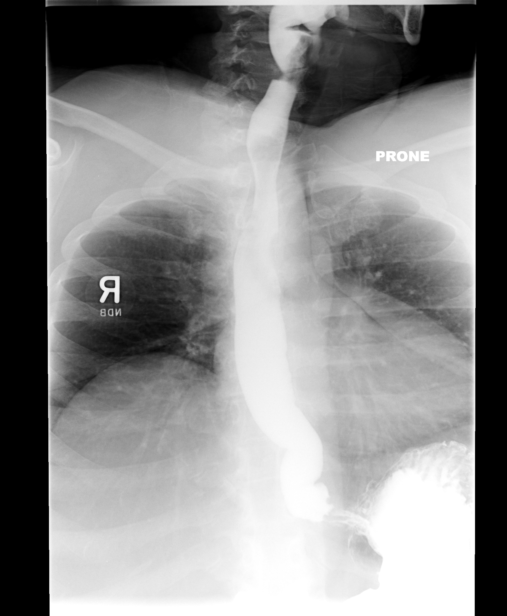

[15 of 18 positions shown; findings below may reference images not displayed]

FINDINGS: KUB:  A nonobstructive bowel gas pattern is seen.  No
abnormal calcifications are noted.  Bony structures appear intact.

Upper GI:  The patient was able swallow barium without difficulty.
Esophageal mucosa and motility appear within normal limits.  A
small sliding variety hiatal hernia was evident.  No associated
gastroesophageal reflux was seen over the course of the exam.

Gastric mucosa and motility appeared within normal limits.  The
duodenal bulb was well distended and has a normal appearance.  The
visualized portion of the proximal small bowel to the level of the
proximal jejunum appears normal.
IMPRESSION: Small sliding variety hiatal hernia.  Otherwise normal upper GI.

## 2014-07-07 IMAGING — US US ABDOMEN COMPLETE
1 series · 14 of 25 positions shown · non-contrast
Comparison: None

CLINICAL DATA: Morbid obesity, pre bariatric surgery

ULTRASOUND ABDOMEN:
TECHNIQUE: Sonography of upper abdominal structures was performed.
Image quality degraded secondary to body habitus.

[Series 1: us abdomen complete · 14 of 55 slices shown]
[im 1/55]
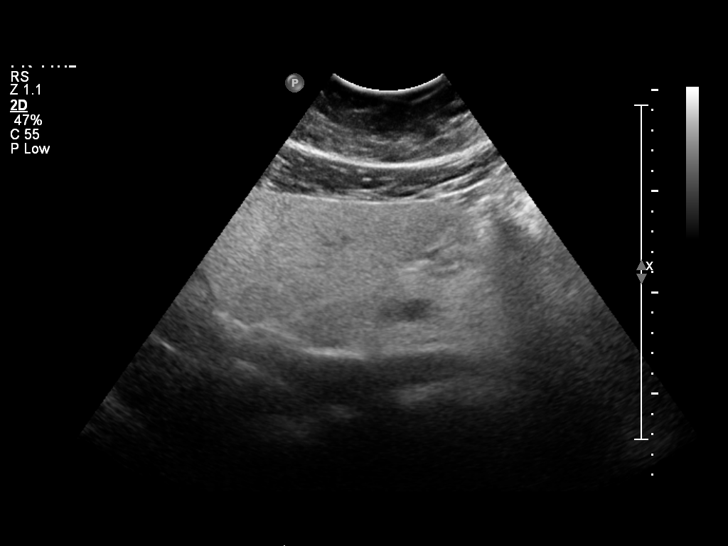
[im 5/55]
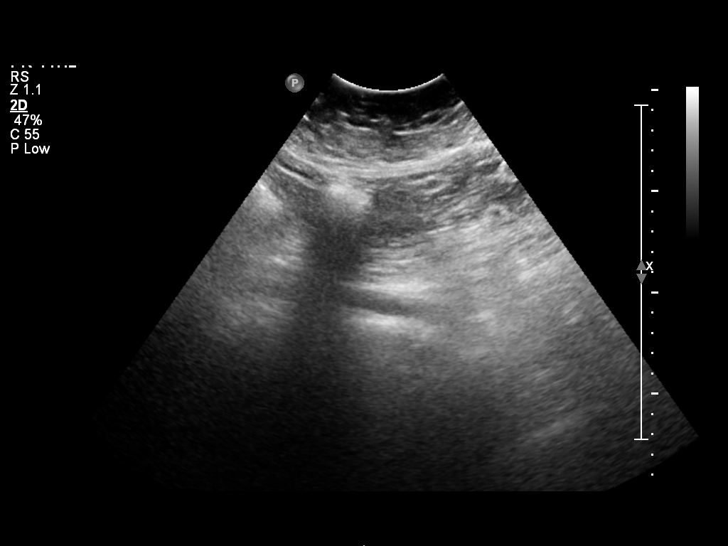
[im 10/55]
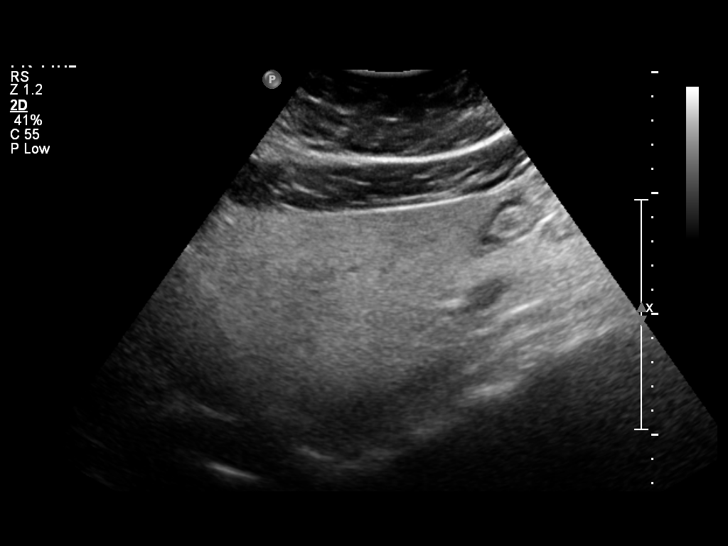
[im 14/55]
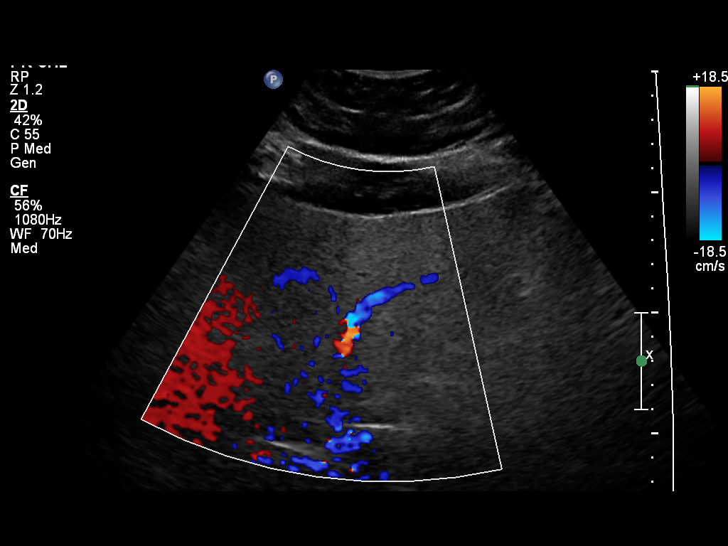
[im 19/55]
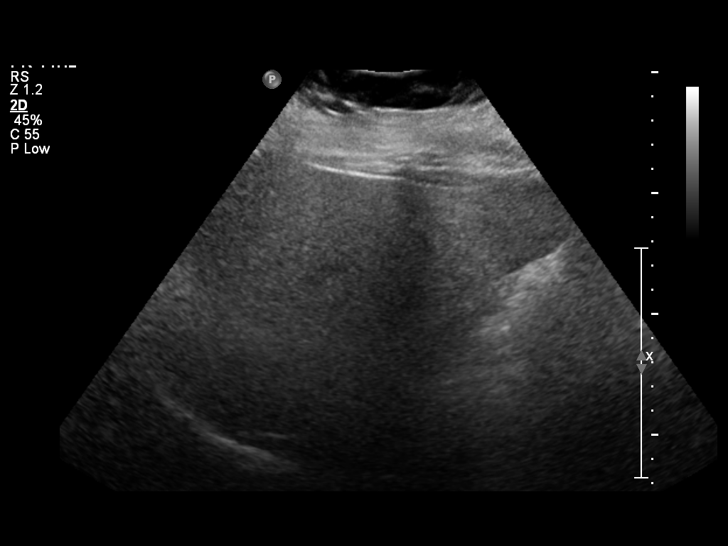
[im 21/55]
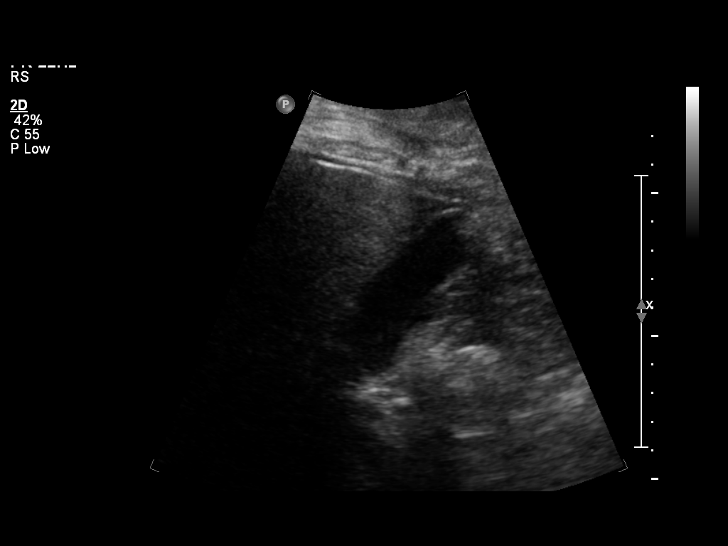
[im 25/55]
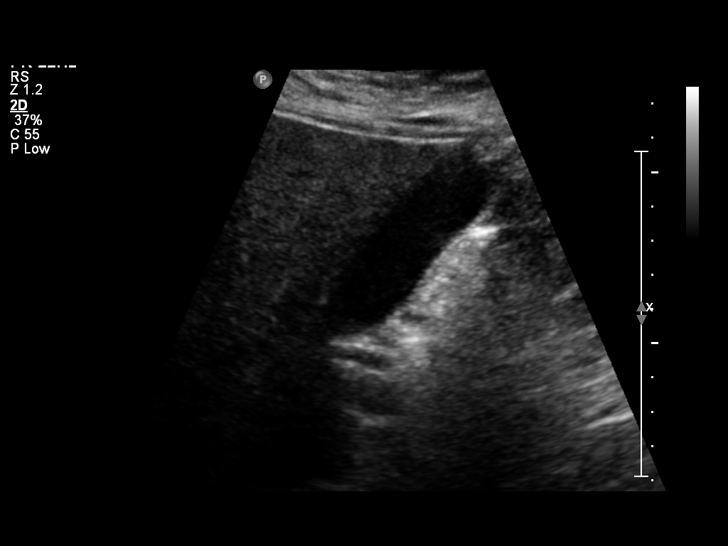
[im 30/55]
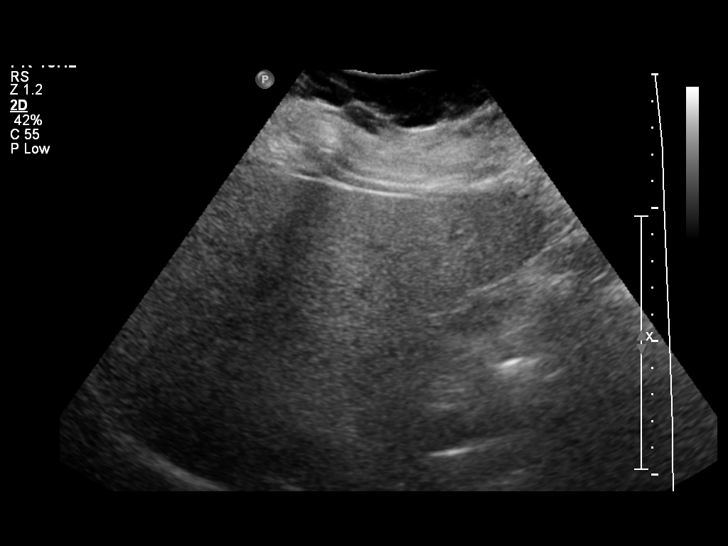
[im 34/55]
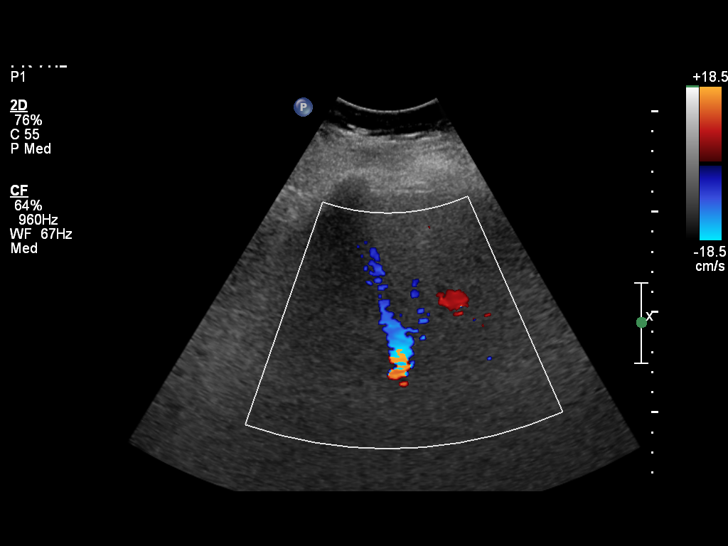
[im 37/55]
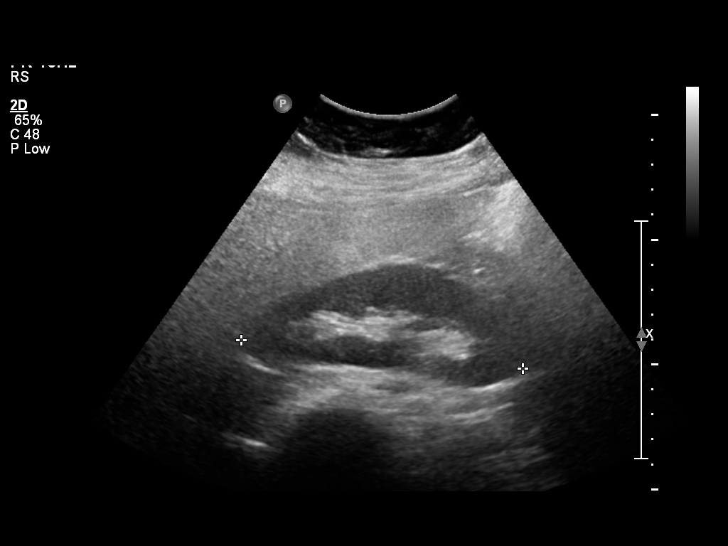
[im 41/55]
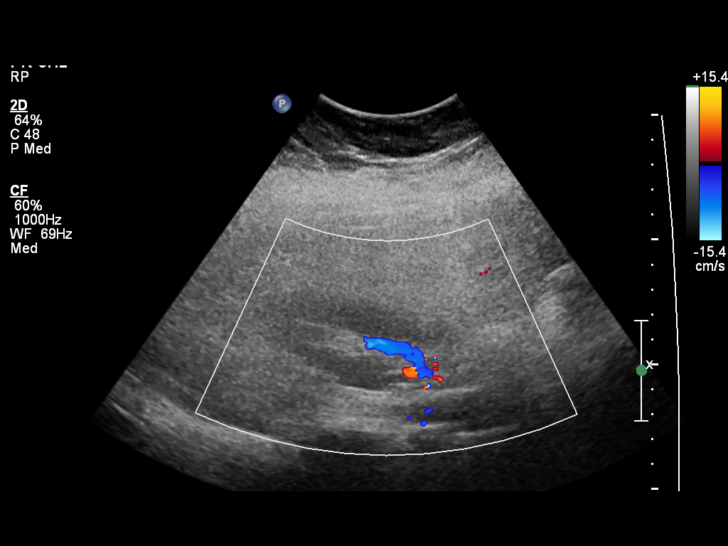
[im 46/55]
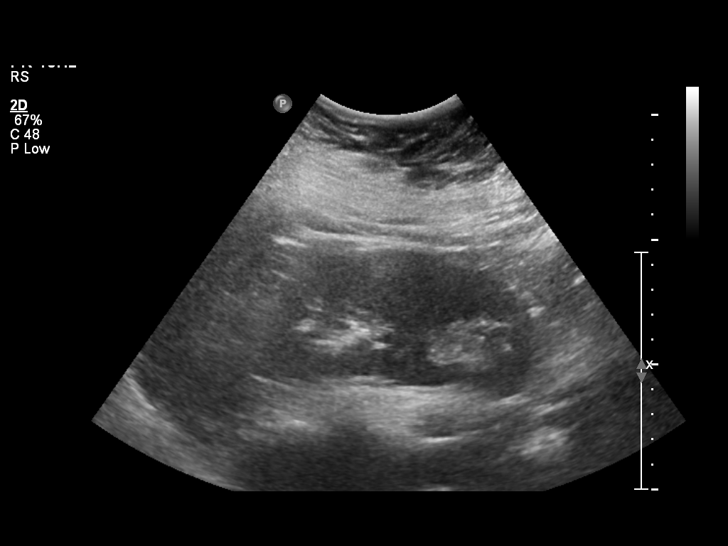
[im 50/55]
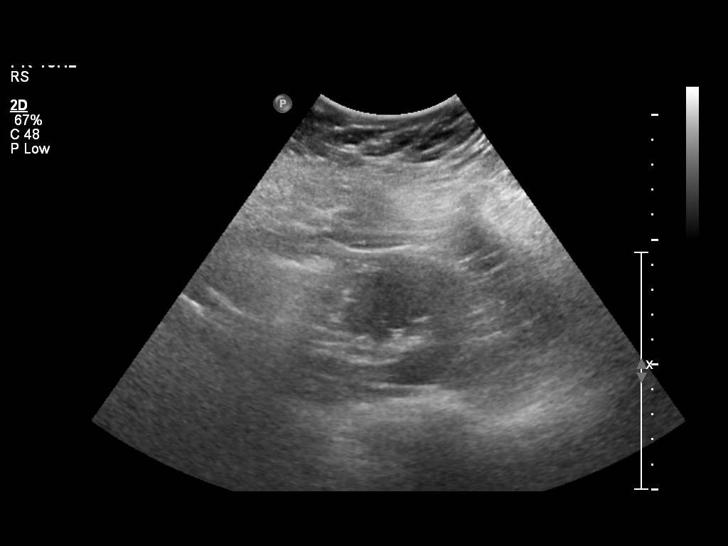
[im 55/55]
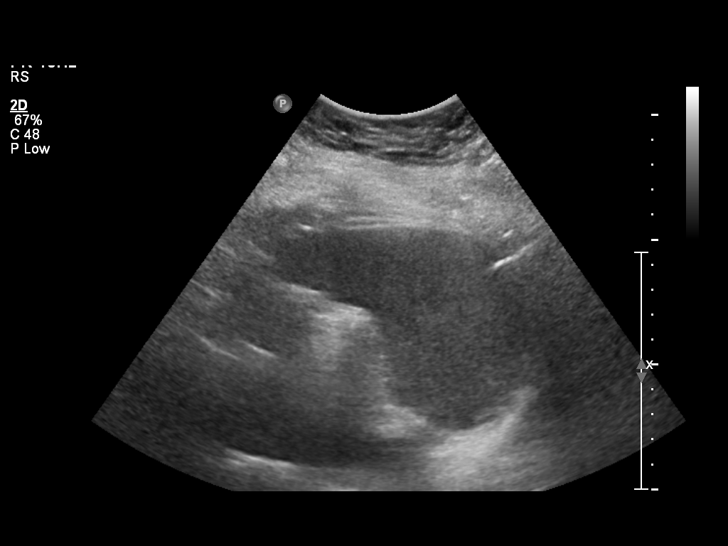

[14 of 25 positions shown; findings below may reference images not displayed]

Gallbladder:  Normally distended without stones or wall thickening.
No pericholecystic fluid or sonographic Murphy sign.

Common bile duct:  4 mm diameter, normal

Liver:  Echogenic, likely fatty infiltration, though this can be
seen with cirrhosis and certain infiltrative disorders.  Suboptimal
visualization of intrahepatic detail due to poor sound transmission
through echogenic parenchyma. No mass or nodularity.

IVC:  Normal appearance

Pancreas:  Tail partially obscured by bowel gas.  Visualized
portions normal.

Spleen:  Normal appearance, 8.1 cm length

Right kidney:  11.3 cm length. Normal morphology without mass or
hydronephrosis.

Left kidney:  11.4 cm length. Normal morphology without mass or
hydronephrosis.

Aorta:  Normal caliber

Other:  No free fluid
IMPRESSION: Probable diffuse fatty infiltration of liver as above, with
suboptimal visualization of intrahepatic detail.
Nonvisualization of pancreatic tail.
Otherwise negative exam.

## 2015-03-08 ENCOUNTER — Other Ambulatory Visit: Payer: Self-pay

## 2015-03-08 DIAGNOSIS — E039 Hypothyroidism, unspecified: Secondary | ICD-10-CM | POA: Insufficient documentation

## 2015-03-08 MED ORDER — LEVOTHYROXINE SODIUM 25 MCG PO TABS: 25.0000 ug | ORAL_TABLET | Freq: Every day | ORAL | Status: DC

## 2015-07-03 ENCOUNTER — Ambulatory Visit: Payer: Self-pay

## 2015-09-01 DIAGNOSIS — E559 Vitamin D deficiency, unspecified: Secondary | ICD-10-CM | POA: Insufficient documentation

## 2015-09-01 DIAGNOSIS — J309 Allergic rhinitis, unspecified: Secondary | ICD-10-CM | POA: Insufficient documentation

## 2015-09-01 DIAGNOSIS — G4730 Sleep apnea, unspecified: Secondary | ICD-10-CM | POA: Insufficient documentation

## 2015-09-01 DIAGNOSIS — L720 Epidermal cyst: Secondary | ICD-10-CM | POA: Insufficient documentation

## 2015-09-07 ENCOUNTER — Ambulatory Visit (INDEPENDENT_AMBULATORY_CARE_PROVIDER_SITE_OTHER): Payer: BLUE CROSS/BLUE SHIELD | Admitting: Family Medicine

## 2015-09-07 ENCOUNTER — Encounter: Payer: Self-pay | Admitting: Family Medicine

## 2015-09-07 VITALS — BP 114/68 | HR 76 | Temp 97.9°F | Resp 16 | Ht 61.5 in | Wt 189.0 lb

## 2015-09-07 DIAGNOSIS — Z1239 Encounter for other screening for malignant neoplasm of breast: Secondary | ICD-10-CM | POA: Diagnosis not present

## 2015-09-07 DIAGNOSIS — Z9884 Bariatric surgery status: Secondary | ICD-10-CM

## 2015-09-07 DIAGNOSIS — Z0000 Encounter for general adult medical examination without abnormal findings: Secondary | ICD-10-CM | POA: Diagnosis not present

## 2015-09-07 DIAGNOSIS — J309 Allergic rhinitis, unspecified: Secondary | ICD-10-CM

## 2015-09-07 DIAGNOSIS — E559 Vitamin D deficiency, unspecified: Secondary | ICD-10-CM | POA: Diagnosis not present

## 2015-09-07 DIAGNOSIS — E7800 Pure hypercholesterolemia, unspecified: Secondary | ICD-10-CM

## 2015-09-07 DIAGNOSIS — Z124 Encounter for screening for malignant neoplasm of cervix: Secondary | ICD-10-CM | POA: Diagnosis not present

## 2015-09-07 DIAGNOSIS — E039 Hypothyroidism, unspecified: Secondary | ICD-10-CM

## 2015-09-07 DIAGNOSIS — K760 Fatty (change of) liver, not elsewhere classified: Secondary | ICD-10-CM

## 2015-09-07 LAB — POCT URINALYSIS DIPSTICK
BILIRUBIN UA: NEGATIVE
GLUCOSE UA: NEGATIVE
KETONES UA: NEGATIVE
Leukocytes, UA: NEGATIVE
Nitrite, UA: NEGATIVE
PH UA: 5
Protein, UA: NEGATIVE
RBC UA: NEGATIVE
SPEC GRAV UA: 1.025
UROBILINOGEN UA: 0.2

## 2015-09-07 NOTE — Progress Notes (Signed)
Patient ID: Melissa Cherry, female   DOB: 11-Mar-1961, 54 y.o.   MRN: [id number]         Patient: Melissa Cherry, Female    DOB: 12-24-1960, 54 y.o.   MRN: [id number] Visit Date: 09/07/2015  Today's Provider: Lorie Phenix, MD   Chief Complaint  Patient presents with  . Annual Exam   Subjective:    Annual physical exam Melissa Cherry is a 54 y.o. female who presents today for health maintenance and complete physical. She feels fairly well.  Pt is feeling more tired recently, she is not taking her thyroid medication regularly.   She reports not exercising right. She reports she is sleeping fairly well.  Has gained some weight.   Needs labs done.     -----------------------------------------------------------------   Review of Systems  Constitutional: Negative.   HENT: Positive for dental problem (Pt is going to need her wisdom tooth removed. ) and postnasal drip. Negative for congestion, drooling, ear discharge, ear pain, facial swelling, hearing loss, mouth sores, nosebleeds, rhinorrhea, sinus pressure, sneezing, sore throat, tinnitus, trouble swallowing and voice change.   Eyes: Negative.   Respiratory: Negative.   Cardiovascular: Negative.   Gastrointestinal: Negative.   Endocrine: Negative.   Genitourinary: Negative.   Musculoskeletal: Negative.   Skin: Negative.   Allergic/Immunologic: Positive for environmental allergies. Negative for food allergies and immunocompromised state.  Neurological: Negative.   Hematological: Negative.   Psychiatric/Behavioral: Negative.     Social History      She  reports that she quit smoking about 18 years ago. She has never used smokeless tobacco. She reports that she drinks alcohol. She reports that she does not use illicit drugs.       Social History   Social History  . Marital Status: Married    Spouse Name: N/A  . Number of Children: 2  . Years of Education: N/A   Social History Main Topics  . Smoking status: Former Smoker    Quit  date: 10/02/1996  . Smokeless tobacco: Never Used  . Alcohol Use: Yes     Comment: Occasionally; 2-3 beers/week at most (during summer)  . Drug Use: No  . Sexual Activity: Not Asked   Other Topics Concern  . None   Social History Narrative    Patient Active Problem List   Diagnosis Date Noted  . Allergic rhinitis 09/01/2015  . Breathing-related sleep disorder 09/01/2015  . Dermoid inclusion cyst 09/01/2015  . Avitaminosis D 09/01/2015  . Hypothyroidism 03/08/2015  . Obesity (BMI 30-39.9) 12/17/2013  . History of Lap Roux-en-Y gastric bypass 04/14/13 05/02/2013  . OSA on CPAP 04/15/2013  . Hypercholesteremia 04/15/2013  . Fatty liver 04/15/2013  . Adiposity 01/08/2010  . Malaise and fatigue 01/06/2010  . Adaptation reaction 11/24/2009  . H/O contraceptive use 12/11/2008  . Family history of cardiovascular disease 12/02/2008  . History of tobacco use 12/02/2008  . Carbuncle and furuncle 11/25/2008  . CD (contact dermatitis) 11/25/2008  . History of methicillin resistant Staphylococcus aureus infection 11/25/2008  . Abnormal C-reactive protein 12/12/2007    Past Surgical History  Procedure Laterality Date  . Tonsillectomy  as child  . Ganglion cyst excision Right 1992  . Cesarean section  2000, 2003  . Dilation and curettage of uterus  1999  . Gastric roux-en-y N/A 04/14/2013    Procedure: LAPAROSCOPIC ROUX-EN-Y GASTRIC BYPASS WITH UPPER ENDOSCOPY;  Surgeon: Atilano Ina, MD;  Location: [facility];  Service: General;  Laterality: N/A;    Family  History        Family Status  Relation Status Death Age  . Father Deceased   . Mother Deceased   . Sister Alive   . Brother Alive   . Sister Alive         Her family history includes Breast cancer in her mother; Dementia in her mother; Diabetes in her father; Healthy in her brother and sister; Heart disease in her father; Mental retardation in her sister.    Allergies  Allergen Reactions  . Tamiflu [Oseltamivir Phosphate]  Rash    Previous Medications   LEVOTHYROXINE (SYNTHROID, LEVOTHROID) 25 MCG TABLET    Take 1 tablet (25 mcg total) by mouth daily before breakfast.   LORATADINE (CLARITIN) 10 MG TABLET    Take 10 mg by mouth daily as needed for allergies.     Patient Care Team: Lorie PhenixNancy Karina Nofsinger, MD as PCP - General (Family Medicine) Loree FeeSara S Himmelrich, RD as Dietitian [facility](Bariatrics)     Objective:   Vitals: BP 114/68 mmHg  Pulse 76  Temp(Src) 97.9 F (36.6 C) (Oral)  Resp 16  Ht 5' 1.5" (1.562 m)  Wt 189 lb (85.73 kg)  BMI 35.14 kg/m2   Physical Exam  Constitutional: She is oriented to person, place, and time. She appears well-developed and well-nourished.  HENT:  Head: Normocephalic and atraumatic.  Right Ear: Tympanic membrane, external ear and ear canal normal.  Left Ear: Tympanic membrane, external ear and ear canal normal.  Nose: Nose normal.  Mouth/Throat: Uvula is midline, oropharynx is clear and moist and mucous membranes are normal.  Eyes: Conjunctivae, EOM and lids are normal. Pupils are equal, round, and reactive to light.  Neck: Trachea normal, normal range of motion and full passive range of motion without pain. Neck supple. Carotid bruit is not present.  Cardiovascular: Normal rate, regular rhythm, S1 normal and normal pulses.   Pulmonary/Chest: Effort normal and breath sounds normal. Right breast exhibits no inverted nipple, no mass, no nipple discharge, no skin change and no tenderness. Left breast exhibits no inverted nipple, no mass, no nipple discharge, no skin change and no tenderness. Breasts are symmetrical.  Abdominal: Soft. Normal appearance, normal aorta and bowel sounds are normal. There is no tenderness.  Genitourinary: Vagina normal and uterus normal. Pelvic exam was performed with patient prone.  Musculoskeletal: Normal range of motion.  Lymphadenopathy:    She has no cervical adenopathy.    She has no axillary adenopathy.  Neurological: She is alert and oriented to  person, place, and time.  Skin: Skin is warm, dry and intact.  Psychiatric: She has a normal mood and affect. Her speech is normal and behavior is normal. Judgment and thought content normal. Cognition and memory are normal.     Depression Screen No flowsheet data found.    Assessment & Plan:     Routine Health Maintenance and Physical Exam  Exercise Activities and Dietary recommendations Goals    None      Immunization History  Administered Date(s) Administered  . Tdap 01/18/2006    Health Maintenance  Topic Date Due  . Hepatitis C Screening  [birth year]  . HIV Screening  07/14/1975  . PAP SMEAR  07/13/1981  . MAMMOGRAM  07/13/2010  . COLONOSCOPY  07/13/2010  . INFLUENZA VACCINE  09/06/2016 (Originally 05/03/2015)  . TETANUS/TDAP  01/19/2016      Discussed health benefits of physical activity, and encouraged her to engage in regular exercise appropriate for her age and condition.   1.  Annual physical exam As above. Stressed importance of healthy diet and exercise. Really needs to adjust her lifestyle.   - POCT urinalysis dipstick  2. Cervical cancer screening - Pap IG and HPV (high risk) DNA detection  3. Avitaminosis D - VITAMIN D 25 Hydroxy (Vit-D Deficiency, Fractures)  4. Hypercholesteremia Check labs.  - Lipid panel  5. Fatty liver Check labs.  - Comprehensive metabolic panel  6. Hypothyroidism, unspecified hypothyroidism type - TSH  7. Allergic rhinitis, unspecified allergic rhinitis type - CBC with Differential/Platelet  8. History of Lap Roux-en-Y gastric bypass 04/14/13 Check labs. Has gained weight. Needs to adjust lifestyle.   - B12 and Folate Panel - Iron Binding Cap (TIBC) - Magnesium - Vitamin B1  9. Breast cancer screening Will schedule.  - MM Digital Screening     Patient was seen and examined by Leo Grosser, MD, and note scribed by Kavin Leech, CMA.  I have reviewed the document for accuracy and completeness and I agree  with above. Leo Grosser, MD   Lorie Phenix, MD  --------------------------------------------------------------------

## 2015-09-10 LAB — COMPREHENSIVE METABOLIC PANEL
ALK PHOS: 115 IU/L (ref 39–117)
ALT: 15 IU/L (ref 0–32)
AST: 25 IU/L (ref 0–40)
Albumin/Globulin Ratio: 2.3 (ref 1.1–2.5)
Albumin: 4.3 g/dL (ref 3.5–5.5)
BUN/Creatinine Ratio: 13 (ref 9–23)
BUN: 9 mg/dL (ref 6–24)
Bilirubin Total: 0.5 mg/dL (ref 0.0–1.2)
CO2: 25 mmol/L (ref 18–29)
Calcium: 9.6 mg/dL (ref 8.7–10.2)
Chloride: 104 mmol/L (ref 97–106)
Creatinine, Ser: 0.68 mg/dL (ref 0.57–1.00)
GFR calc Af Amer: 114 mL/min/{1.73_m2} (ref >59)
GFR calc non Af Amer: 99 mL/min/{1.73_m2} (ref >59)
GLUCOSE: 79 mg/dL (ref 65–99)
Globulin, Total: 1.9 g/dL (ref 1.5–4.5)
Potassium: 4.3 mmol/L (ref 3.5–5.2)
Sodium: 146 mmol/L — ABNORMAL HIGH (ref 136–144)
Total Protein: 6.2 g/dL (ref 6.0–8.5)

## 2015-09-10 LAB — IRON AND TIBC
IRON SATURATION: 43 % (ref 15–55)
Iron: 144 ug/dL (ref 27–159)
Total Iron Binding Capacity: 333 ug/dL (ref 250–450)
UIBC: 189 ug/dL (ref 131–425)

## 2015-09-10 LAB — CBC WITH DIFFERENTIAL/PLATELET
BASOS: 1 %
Basophils Absolute: 0 10*3/uL (ref 0.0–0.2)
EOS (ABSOLUTE): 0.2 10*3/uL (ref 0.0–0.4)
EOS: 6 %
HEMATOCRIT: 40.8 % (ref 34.0–46.6)
Hemoglobin: 13.7 g/dL (ref 11.1–15.9)
Immature Grans (Abs): 0 10*3/uL (ref 0.0–0.1)
Immature Granulocytes: 0 %
LYMPHS ABS: 1.5 10*3/uL (ref 0.7–3.1)
Lymphs: 36 %
MCH: 30.9 pg (ref 26.6–33.0)
MCHC: 33.6 g/dL (ref 31.5–35.7)
MCV: 92 fL (ref 79–97)
MONOCYTES: 8 %
MONOS ABS: 0.3 10*3/uL (ref 0.1–0.9)
NEUTROS ABS: 2.1 10*3/uL (ref 1.4–7.0)
Neutrophils: 49 %
Platelets: 216 10*3/uL (ref 150–379)
RBC: 4.43 x10E6/uL (ref 3.77–5.28)
RDW: 13.6 % (ref 12.3–15.4)
WBC: 4.2 10*3/uL (ref 3.4–10.8)

## 2015-09-10 LAB — LIPID PANEL
CHOLESTEROL TOTAL: 222 mg/dL — AB (ref 100–199)
Chol/HDL Ratio: 3.2 ratio units (ref 0.0–4.4)
HDL: 70 mg/dL (ref >39)
LDL Calculated: 135 mg/dL — ABNORMAL HIGH (ref 0–99)
TRIGLYCERIDES: 87 mg/dL (ref 0–149)
VLDL CHOLESTEROL CAL: 17 mg/dL (ref 5–40)

## 2015-09-10 LAB — B12 AND FOLATE PANEL
Folate: 15.7 ng/mL (ref >3.0)
Vitamin B-12: 633 pg/mL (ref 211–946)

## 2015-09-10 LAB — VITAMIN B1: THIAMINE: 129.2 nmol/L (ref 66.5–200.0)

## 2015-09-10 LAB — TSH: TSH: 5.09 u[IU]/mL — ABNORMAL HIGH (ref 0.450–4.500)

## 2015-09-10 LAB — VITAMIN D 25 HYDROXY (VIT D DEFICIENCY, FRACTURES): Vit D, 25-Hydroxy: 14.5 ng/mL — ABNORMAL LOW (ref 30.0–100.0)

## 2015-09-10 LAB — MAGNESIUM: MAGNESIUM: 2 mg/dL (ref 1.6–2.3)

## 2015-09-13 ENCOUNTER — Telehealth: Payer: Self-pay

## 2015-09-13 DIAGNOSIS — E039 Hypothyroidism, unspecified: Secondary | ICD-10-CM

## 2015-09-13 MED ORDER — LEVOTHYROXINE SODIUM 50 MCG PO TABS: 50.0000 ug | ORAL_TABLET | Freq: Every day | ORAL | Status: DC

## 2015-09-13 NOTE — Telephone Encounter (Signed)
-----   Message from Nancy Maloney, MD sent at 09/11/2015 12:40 PM EST ----- VIt D low.  Please clarify if taking supplements. Also thyroid slightly low also. If low also, could recommend increase Synthroid to 50 mcg and recheck in 6 weeks. Thanks. 

## 2015-09-13 NOTE — Telephone Encounter (Signed)
-----   Message from Lorie PhenixNancy Maloney, MD sent at 09/11/2015 12:40 PM EST ----- VIt D low.  Please clarify if taking supplements. Also thyroid slightly low also. If low also, could recommend increase Synthroid to 50 mcg and recheck in 6 weeks. Thanks.

## 2015-09-13 NOTE — Telephone Encounter (Signed)
Advised patient as below. Patient reports that she was just taking a multivitamin, but she will add OTC Vit D supplement. Send medication into pharmacy.

## 2015-09-13 NOTE — Telephone Encounter (Signed)
Left message to call back  

## 2015-09-14 LAB — PAP IG AND HPV HIGH-RISK
HPV, high-risk: NEGATIVE
PAP SMEAR COMMENT: 0

## 2015-09-15 ENCOUNTER — Telehealth: Payer: Self-pay

## 2015-09-15 NOTE — Telephone Encounter (Signed)
Patient advised as directed below. 

## 2015-09-15 NOTE — Telephone Encounter (Signed)
-----   Message from Lorie PhenixNancy Maloney, MD sent at 09/14/2015  4:47 PM EST ----- Pap is normal. Please notify patient.   Thanks.

## 2015-09-22 LAB — HM MAMMOGRAPHY

## 2016-03-23 DIAGNOSIS — L255 Unspecified contact dermatitis due to plants, except food: Secondary | ICD-10-CM | POA: Diagnosis not present

## 2016-03-23 DIAGNOSIS — L0390 Cellulitis, unspecified: Secondary | ICD-10-CM | POA: Diagnosis not present

## 2016-05-10 ENCOUNTER — Other Ambulatory Visit: Payer: Self-pay | Admitting: Family Medicine

## 2016-05-10 DIAGNOSIS — E039 Hypothyroidism, unspecified: Secondary | ICD-10-CM

## 2016-06-30 ENCOUNTER — Encounter (HOSPITAL_COMMUNITY): Payer: Self-pay

## 2016-07-03 ENCOUNTER — Other Ambulatory Visit: Payer: Self-pay | Admitting: Family Medicine

## 2016-07-03 DIAGNOSIS — E039 Hypothyroidism, unspecified: Secondary | ICD-10-CM

## 2016-09-21 ENCOUNTER — Telehealth: Payer: Self-pay | Admitting: Family Medicine

## 2016-10-09 ENCOUNTER — Encounter: Payer: Self-pay | Admitting: Physician Assistant

## 2016-10-09 ENCOUNTER — Ambulatory Visit (INDEPENDENT_AMBULATORY_CARE_PROVIDER_SITE_OTHER): Payer: BLUE CROSS/BLUE SHIELD | Admitting: Physician Assistant

## 2016-10-09 VITALS — BP 120/62 | HR 64 | Temp 98.1°F | Resp 16 | Ht 62.0 in | Wt 198.0 lb

## 2016-10-09 DIAGNOSIS — E039 Hypothyroidism, unspecified: Secondary | ICD-10-CM | POA: Diagnosis not present

## 2016-10-09 MED ORDER — LEVOTHYROXINE SODIUM 25 MCG PO TABS: 25.0000 ug | ORAL_TABLET | Freq: Every day | ORAL | 1 refills | Status: DC

## 2016-10-09 NOTE — Patient Instructions (Signed)
Hypothyroidism Hypothyroidism is a disorder of the thyroid. The thyroid is a large gland that is located in the lower front of the neck. The thyroid releases hormones that control how the body works. With hypothyroidism, the thyroid does not make enough of these hormones. What are the causes? Causes of hypothyroidism may include:  Viral infections.  Pregnancy.  Your own defense system (immune system) attacking your thyroid.  Certain medicines.  Birth defects.  Past radiation treatments to your head or neck.  Past treatment with radioactive iodine.  Past surgical removal of part or all of your thyroid.  Problems with the gland that is located in the center of your brain (pituitary).  What are the signs or symptoms? Signs and symptoms of hypothyroidism may include:  Feeling as though you have no energy (lethargy).  Inability to tolerate cold.  Weight gain that is not explained by a change in diet or exercise habits.  Dry skin.  Coarse hair.  Menstrual irregularity.  Slowing of thought processes.  Constipation.  Sadness or depression.  How is this diagnosed? Your health care provider may diagnose hypothyroidism with blood tests and ultrasound tests. How is this treated? Hypothyroidism is treated with medicine that replaces the hormones that your body does not make. After you begin treatment, it may take several weeks for symptoms to go away. Follow these instructions at home:  Take medicines only as directed by your health care provider.  If you start taking any new medicines, tell your health care provider.  Keep all follow-up visits as directed by your health care provider. This is important. As your condition improves, your dosage needs may change. You will need to have blood tests regularly so that your health care provider can watch your condition. Contact a health care provider if:  Your symptoms do not get better with treatment.  You are taking thyroid  replacement medicine and: ? You sweat excessively. ? You have tremors. ? You feel anxious. ? You lose weight rapidly. ? You cannot tolerate heat. ? You have emotional swings. ? You have diarrhea. ? You feel weak. Get help right away if:  You develop chest pain.  You develop an irregular heartbeat.  You develop a rapid heartbeat. This information is not intended to replace advice given to you by your health care provider. Make sure you discuss any questions you have with your health care provider. Document Released: 09/18/2005 Document Revised: 02/24/2016 Document Reviewed: 02/03/2014 Elsevier Interactive Patient Education  2017 [employer]  

## 2016-10-09 NOTE — Progress Notes (Signed)
Patient: Melissa Cherry Female    DOB: 1961/02/06   56 y.o.   MRN: [id number] Visit Date: 10/09/2016  Today's Provider: Margaretann Loveless, PA-C   Chief Complaint  Patient presents with  . Hypothyroidism   Subjective:    HPI  Hypothyroid, follow-up:  TSH  Date Value Ref Range Status  09/08/2015 5.090 (H) 0.450 - 4.500 uIU/mL Final  09/29/2013 3.210 0.350 - 4.500 uIU/mL Final  02/05/2013 5.054 (H) 0.350 - 4.500 uIU/mL Final   Wt Readings from Last 3 Encounters:  10/09/16 198 lb (89.8 kg)  09/07/15 189 lb (85.7 kg)  05/27/14 160 lb (72.6 kg)    She was last seen for hypothyroid 1 years ago.  Management since that visit includes checking labs, synthroid increased to 50 mcg . She reports fair compliance with treatment. She is not having side effects.  She is not exercising. She is experiencing change in energy level She denies diarrhea Weight trend: increasing steadily  Patient is s/p gastric bypass done in 04/2013. She has not had any labs recently and was due for her yearly f/u with her surgeon 05/2016 but did not go due to issues at home.   She has history of sleep apnea as well. She does have CPAP at home but does not use it since her surgery and weight loss.  ------------------------------------------------------------------------     Allergies  Allergen Reactions  . Tamiflu [Oseltamivir Phosphate] Rash     Current Outpatient Prescriptions:  .  levothyroxine (SYNTHROID, LEVOTHROID) 50 MCG tablet, Take 1 tablet (50 mcg total) by mouth daily before breakfast. (Patient not taking: Reported on 10/09/2016), Disp: 30 tablet, Rfl: 5 .  loratadine (CLARITIN) 10 MG tablet, Take 10 mg by mouth daily as needed for allergies. , Disp: , Rfl:   Review of Systems  Constitutional: Positive for fatigue.  Respiratory: Negative.   Cardiovascular: Negative.   Gastrointestinal: Negative.   Endocrine: Negative.   Neurological: Negative.   Psychiatric/Behavioral: Negative.       Social History  Substance Use Topics  . Smoking status: Former Smoker    Quit date: 10/02/1996  . Smokeless tobacco: Never Used  . Alcohol use Yes     Comment: Occasionally; 2-3 beers/week at most (during summer)   Objective:   BP 120/62 (BP Location: Right Arm, Patient Position: Sitting, Cuff Size: Large)   Pulse 64   Temp 98.1 F (36.7 C) (Oral)   Resp 16   Ht 5\' 2"  (1.575 m)   Wt 198 lb (89.8 kg)   BMI 36.21 kg/m   Physical Exam  Constitutional: She appears well-developed and well-nourished. No distress.  Neck: Normal range of motion. Neck supple.  Cardiovascular: Normal rate, regular rhythm and normal heart sounds.  Exam reveals no gallop and no friction rub.   No murmur heard. Pulmonary/Chest: Effort normal and breath sounds normal. No respiratory distress. She has no wheezes. She has no rales.  Skin: She is not diaphoretic.  Psychiatric: She has a normal mood and affect. Her behavior is normal. Judgment and thought content normal.  Vitals reviewed.      Assessment & Plan:     1. Hypothyroidism, unspecified type Patient has been noncompliant with levothyroxine dosing. Will recheck labs as below and start back on levothyroxine since she has not been taking it. I will then see her back in 4-6 weeks for her CPE. At that time we will recheck all labs, including minerals for gastric bypass surgery.  -  levothyroxine (SYNTHROID, LEVOTHROID) 25 MCG tablet; Take 1 tablet (25 mcg total) by mouth daily before breakfast.  Dispense: 30 tablet; Refill: 1 - Thyroid Panel With TSH       Margaretann LovelessJennifer M Chaniece Barbato, PA-C  [facility]

## 2016-10-10 ENCOUNTER — Telehealth: Payer: Self-pay

## 2016-10-10 LAB — THYROID PANEL WITH TSH
FREE THYROXINE INDEX: 1.1 — AB (ref 1.2–4.9)
T3 UPTAKE RATIO: 24 % (ref 24–39)
T4, Total: 4.6 ug/dL (ref 4.5–12.0)
TSH: 5.41 u[IU]/mL — AB (ref 0.450–4.500)

## 2016-10-10 NOTE — Telephone Encounter (Signed)
-----   Message from Margaretann LovelessJennifer M Burnette, PA-C sent at 10/10/2016  8:16 AM EST ----- Thyroid is hypoactive with TSH at 5.410. T4 is normal. Ok to start levothyroxine 25mcg and we will recheck at your CPE appt.

## 2016-10-10 NOTE — Telephone Encounter (Signed)
Advised pt of lab results. Pt verbally acknowledges understanding. Emily Drozdowski, CMA   

## 2016-11-08 ENCOUNTER — Encounter: Payer: BLUE CROSS/BLUE SHIELD | Admitting: Physician Assistant

## 2016-11-09 ENCOUNTER — Encounter: Payer: Self-pay | Admitting: Physician Assistant

## 2016-11-09 ENCOUNTER — Ambulatory Visit (INDEPENDENT_AMBULATORY_CARE_PROVIDER_SITE_OTHER): Payer: BLUE CROSS/BLUE SHIELD | Admitting: Physician Assistant

## 2016-11-09 VITALS — BP 110/70 | HR 62 | Temp 98.2°F | Resp 16 | Ht 61.5 in | Wt 194.0 lb

## 2016-11-09 DIAGNOSIS — Z1239 Encounter for other screening for malignant neoplasm of breast: Secondary | ICD-10-CM

## 2016-11-09 DIAGNOSIS — M722 Plantar fascial fibromatosis: Secondary | ICD-10-CM | POA: Diagnosis not present

## 2016-11-09 DIAGNOSIS — Z1159 Encounter for screening for other viral diseases: Secondary | ICD-10-CM

## 2016-11-09 DIAGNOSIS — Z833 Family history of diabetes mellitus: Secondary | ICD-10-CM

## 2016-11-09 DIAGNOSIS — R87610 Atypical squamous cells of undetermined significance on cytologic smear of cervix (ASC-US): Secondary | ICD-10-CM | POA: Diagnosis not present

## 2016-11-09 DIAGNOSIS — E7800 Pure hypercholesterolemia, unspecified: Secondary | ICD-10-CM | POA: Diagnosis not present

## 2016-11-09 DIAGNOSIS — Z803 Family history of malignant neoplasm of breast: Secondary | ICD-10-CM | POA: Diagnosis not present

## 2016-11-09 DIAGNOSIS — Z1211 Encounter for screening for malignant neoplasm of colon: Secondary | ICD-10-CM | POA: Diagnosis not present

## 2016-11-09 DIAGNOSIS — Z23 Encounter for immunization: Secondary | ICD-10-CM | POA: Diagnosis not present

## 2016-11-09 DIAGNOSIS — Z0000 Encounter for general adult medical examination without abnormal findings: Secondary | ICD-10-CM | POA: Diagnosis not present

## 2016-11-09 DIAGNOSIS — E039 Hypothyroidism, unspecified: Secondary | ICD-10-CM

## 2016-11-09 DIAGNOSIS — Z1231 Encounter for screening mammogram for malignant neoplasm of breast: Secondary | ICD-10-CM | POA: Diagnosis not present

## 2016-11-09 DIAGNOSIS — Z114 Encounter for screening for human immunodeficiency virus [HIV]: Secondary | ICD-10-CM

## 2016-11-09 DIAGNOSIS — Z8249 Family history of ischemic heart disease and other diseases of the circulatory system: Secondary | ICD-10-CM

## 2016-11-09 DIAGNOSIS — M76892 Other specified enthesopathies of left lower limb, excluding foot: Secondary | ICD-10-CM

## 2016-11-09 DIAGNOSIS — K760 Fatty (change of) liver, not elsewhere classified: Secondary | ICD-10-CM | POA: Diagnosis not present

## 2016-11-09 DIAGNOSIS — Z124 Encounter for screening for malignant neoplasm of cervix: Secondary | ICD-10-CM

## 2016-11-09 NOTE — Patient Instructions (Addendum)
Health Maintenance, Female Introduction Adopting a healthy lifestyle and getting preventive care can go a long way to promote health and wellness. Talk with your health care provider about what schedule of regular examinations is right for you. This is a good chance for you to check in with your provider about disease prevention and staying healthy. In between checkups, there are plenty of things you can do on your own. Experts have done a lot of research about which lifestyle changes and preventive measures are most likely to keep you healthy. Ask your health care provider for more information. Weight and diet Eat a healthy diet  Be sure to include plenty of vegetables, fruits, low-fat dairy products, and lean protein.  Do not eat a lot of foods high in solid fats, added sugars, or salt.  Get regular exercise. This is one of the most important things you can do for your health.  Most adults should exercise for at least 150 minutes each week. The exercise should increase your heart rate and make you sweat (moderate-intensity exercise).  Most adults should also do strengthening exercises at least twice a week. This is in addition to the moderate-intensity exercise. Maintain a healthy weight  Body mass index (BMI) is a measurement that can be used to identify possible weight problems. It estimates body fat based on height and weight. Your health care provider can help determine your BMI and help you achieve or maintain a healthy weight.  For females 4 years of age and older:  A BMI below 18.5 is considered underweight.  A BMI of 18.5 to 24.9 is normal.  A BMI of 25 to 29.9 is considered overweight.  A BMI of 30 and above is considered obese. Watch levels of cholesterol and blood lipids  You should start having your blood tested for lipids and cholesterol at 57 years of age, then have this test every 5 years.  You may need to have your cholesterol levels checked more often  if:  Your lipid or cholesterol levels are high.  You are older than 57 years of age.  You are at high risk for heart disease. Cancer screening Lung Cancer  Lung cancer screening is recommended for adults 12-31 years old who are at high risk for lung cancer because of a history of smoking.  A yearly low-dose CT scan of the lungs is recommended for people who:  Currently smoke.  Have quit within the past 15 years.  Have at least a 30-pack-year history of smoking. A pack year is smoking an average of one pack of cigarettes a day for 1 year.  Yearly screening should continue until it has been 15 years since you quit.  Yearly screening should stop if you develop a health problem that would prevent you from having lung cancer treatment. Breast Cancer  Practice breast self-awareness. This means understanding how your breasts normally appear and feel.  It also means doing regular breast self-exams. Let your health care provider know about any changes, no matter how small.  If you are in your 20s or 30s, you should have a clinical breast exam (CBE) by a health care provider every 1-3 years as part of a regular health exam.  If you are 74 or older, have a CBE every year. Also consider having a breast X-Aggarwal (mammogram) every year.  If you have a family history of breast cancer, talk to your health care provider about genetic screening.  If you are at high risk for breast cancer,  talk to your health care provider about having an MRI and a mammogram every year.  Breast cancer gene (BRCA) assessment is recommended for women who have family members with BRCA-related cancers. BRCA-related cancers include:  Breast.  Ovarian.  Tubal.  Peritoneal cancers.  Results of the assessment will determine the need for genetic counseling and BRCA1 and BRCA2 testing. Cervical Cancer  Your health care provider may recommend that you be screened regularly for cancer of the pelvic organs (ovaries,  uterus, and vagina). This screening involves a pelvic examination, including checking for microscopic changes to the surface of your cervix (Pap test). You may be encouraged to have this screening done every 3 years, beginning at age 21.  For women ages 30-65, health care providers may recommend pelvic exams and Pap testing every 3 years, or they may recommend the Pap and pelvic exam, combined with testing for human papilloma virus (HPV), every 5 years. Some types of HPV increase your risk of cervical cancer. Testing for HPV may also be done on women of any age with unclear Pap test results.  Other health care providers may not recommend any screening for nonpregnant women who are considered low risk for pelvic cancer and who do not have symptoms. Ask your health care provider if a screening pelvic exam is right for you.  If you have had past treatment for cervical cancer or a condition that could lead to cancer, you need Pap tests and screening for cancer for at least 20 years after your treatment. If Pap tests have been discontinued, your risk factors (such as having a new sexual partner) need to be reassessed to determine if screening should resume. Some women have medical problems that increase the chance of getting cervical cancer. In these cases, your health care provider may recommend more frequent screening and Pap tests. Colorectal Cancer  This type of cancer can be detected and often prevented.  Routine colorectal cancer screening usually begins at 57 years of age and continues through 57 years of age.  Your health care provider may recommend screening at an earlier age if you have risk factors for colon cancer.  Your health care provider may also recommend using home test kits to check for hidden blood in the stool.  A small camera at the end of a tube can be used to examine your colon directly (sigmoidoscopy or colonoscopy). This is done to check for the earliest forms of colorectal  cancer.  Routine screening usually begins at age 50.  Direct examination of the colon should be repeated every 5-10 years through 57 years of age. However, you may need to be screened more often if early forms of precancerous polyps or small growths are found. Skin Cancer  Check your skin from head to toe regularly.  Tell your health care provider about any new moles or changes in moles, especially if there is a change in a mole's shape or color.  Also tell your health care provider if you have a mole that is larger than the size of a pencil eraser.  Always use sunscreen. Apply sunscreen liberally and repeatedly throughout the day.  Protect yourself by wearing long sleeves, pants, a wide-brimmed hat, and sunglasses whenever you are outside. Heart disease, diabetes, and high blood pressure  High blood pressure causes heart disease and increases the risk of stroke. High blood pressure is more likely to develop in:  People who have blood pressure in the high end of the normal range (130-139/85-89 mm Hg).    People who are overweight or obese.  People who are African American.  If you are 18-39 years of age, have your blood pressure checked every 3-5 years. If you are 40 years of age or older, have your blood pressure checked every year. You should have your blood pressure measured twice-once when you are at a hospital or clinic, and once when you are not at a hospital or clinic. Record the average of the two measurements. To check your blood pressure when you are not at a hospital or clinic, you can use:  An automated blood pressure machine at a pharmacy.  A home blood pressure monitor.  If you are between 55 years and 79 years old, ask your health care provider if you should take aspirin to prevent strokes.  Have regular diabetes screenings. This involves taking a blood sample to check your fasting blood sugar level.  If you are at a normal weight and have a low risk for diabetes,  have this test once every three years after 57 years of age.  If you are overweight and have a high risk for diabetes, consider being tested at a younger age or more often. Preventing infection Hepatitis B  If you have a higher risk for hepatitis B, you should be screened for this virus. You are considered at high risk for hepatitis B if:  You were born in a country where hepatitis B is common. Ask your health care provider which countries are considered high risk.  Your parents were born in a high-risk country, and you have not been immunized against hepatitis B (hepatitis B vaccine).  You have HIV or AIDS.  You use needles to inject street drugs.  You live with someone who has hepatitis B.  You have had sex with someone who has hepatitis B.  You get hemodialysis treatment.  You take certain medicines for conditions, including cancer, organ transplantation, and autoimmune conditions. Hepatitis C  Blood testing is recommended for:  Everyone born from 1945 through 1965.  Anyone with known risk factors for hepatitis C. Sexually transmitted infections (STIs)  You should be screened for sexually transmitted infections (STIs) including gonorrhea and chlamydia if:  You are sexually active and are younger than 57 years of age.  You are older than 57 years of age and your health care provider tells you that you are at risk for this type of infection.  Your sexual activity has changed since you were last screened and you are at an increased risk for chlamydia or gonorrhea. Ask your health care provider if you are at risk.  If you do not have HIV, but are at risk, it may be recommended that you take a prescription medicine daily to prevent HIV infection. This is called pre-exposure prophylaxis (PrEP). You are considered at risk if:  You are sexually active and do not regularly use condoms or know the HIV status of your partner(s).  You take drugs by injection.  You are sexually  active with a partner who has HIV. Talk with your health care provider about whether you are at high risk of being infected with HIV. If you choose to begin PrEP, you should first be tested for HIV. You should then be tested every 3 months for as long as you are taking PrEP. Pregnancy  If you are premenopausal and you may become pregnant, ask your health care provider about preconception counseling.  If you may become pregnant, take 400 to 800 micrograms (mcg) of folic acid   every day.  If you want to prevent pregnancy, talk to your health care provider about birth control (contraception). Osteoporosis and menopause  Osteoporosis is a disease in which the bones lose minerals and strength with aging. This can result in serious bone fractures. Your risk for osteoporosis can be identified using a bone density scan.  If you are 64 years of age or older, or if you are at risk for osteoporosis and fractures, ask your health care provider if you should be screened.  Ask your health care provider whether you should take a calcium or vitamin D supplement to lower your risk for osteoporosis.  Menopause may have certain physical symptoms and risks.  Hormone replacement therapy may reduce some of these symptoms and risks. Talk to your health care provider about whether hormone replacement therapy is right for you. Follow these instructions at home:  Schedule regular health, dental, and eye exams.  Stay current with your immunizations.  Do not use any tobacco products including cigarettes, chewing tobacco, or electronic cigarettes.  If you are pregnant, do not drink alcohol.  If you are breastfeeding, limit how much and how often you drink alcohol.  Limit alcohol intake to no more than 1 drink per day for nonpregnant women. One drink equals 12 ounces of beer, 5 ounces of wine, or 1 ounces of hard liquor.  Do not use street drugs.  Do not share needles.  Ask your health care provider for  help if you need support or information about quitting drugs.  Tell your health care provider if you often feel depressed.  Tell your health care provider if you have ever been abused or do not feel safe at home. This information is not intended to replace advice given to you by your health care provider. Make sure you discuss any questions you have with your health care provider. Document Released: 04/03/2011 Document Revised: 02/24/2016 Document Reviewed: 06/22/2015  2017 [employer]     Why follow it? Research shows. . Those who follow the Mediterranean diet have a reduced risk of heart disease  . The diet is associated with a reduced incidence of Parkinson's and Alzheimer's diseases . People following the diet may have longer life expectancies and lower rates of chronic diseases  . The Dietary Guidelines for Americans recommends the Mediterranean diet as an eating plan to promote health and prevent disease  What Is the Mediterranean Diet?  . Healthy eating plan based on typical foods and recipes of Mediterranean-style cooking . The diet is primarily a plant based diet; these foods should make up a majority of meals   Starches - Plant based foods should make up a majority of meals - They are an important sources of vitamins, minerals, energy, antioxidants, and fiber - Choose whole grains, foods high in fiber and minimally processed items  - Typical grain sources include wheat, oats, barley, corn, brown rice, bulgar, farro, millet, polenta, couscous  - Various types of beans include chickpeas, lentils, fava beans, black beans, white beans   Fruits  Veggies - Large quantities of antioxidant rich fruits & veggies; 6 or more servings  - Vegetables can be eaten raw or lightly drizzled with oil and cooked  - Vegetables common to the traditional Mediterranean Diet include: artichokes, arugula, beets, broccoli, brussel sprouts, cabbage, carrots, celery, collard greens, cucumbers, eggplant, kale,  leeks, lemons, lettuce, mushrooms, okra, onions, peas, peppers, potatoes, pumpkin, radishes, rutabaga, shallots, spinach, sweet potatoes, turnips, zucchini - Fruits common to the Mediterranean Diet include: apples,  apricots, avocados, cherries, clementines, dates, figs, grapefruits, grapes, melons, nectarines, oranges, peaches, pears, pomegranates, strawberries, tangerines  Fats - Replace butter and margarine with healthy oils, such as olive oil, canola oil, and tahini  - Limit nuts to no more than a handful a day  - Nuts include walnuts, almonds, pecans, pistachios, pine nuts  - Limit or avoid candied, honey roasted or heavily salted nuts - Olives are central to the Mediterranean diet - can be eaten whole or used in a variety of dishes   Meats Protein - Limiting red meat: no more than a few times a month - When eating red meat: choose lean cuts and keep the portion to the size of deck of cards - Eggs: approx. 0 to 4 times a week  - Fish and lean poultry: at least 2 a week  - Healthy protein sources include, chicken, [place], lean beef, lamb - Increase intake of seafood such as tuna, salmon, trout, mackerel, shrimp, scallops - Avoid or limit high fat processed meats such as sausage and bacon  Dairy - Include moderate amounts of low fat dairy products  - Focus on healthy dairy such as fat free yogurt, skim milk, low or reduced fat cheese - Limit dairy products higher in fat such as whole or 2% milk, cheese, ice cream  Alcohol - Moderate amounts of red wine is ok  - No more than 5 oz daily for women (all ages) and men older than age 64  - No more than 10 oz of wine daily for men younger than 62  Other - Limit sweets and other desserts  - Use herbs and spices instead of salt to flavor foods  - Herbs and spices common to the traditional Mediterranean Diet include: basil, bay leaves, chives, cloves, cumin, fennel, garlic, lavender, marjoram, mint, oregano, parsley, pepper, rosemary, sage, savory,  sumac, tarragon, thyme   It's not just a diet, it's a lifestyle:  . The Mediterranean diet includes lifestyle factors typical of those in the region  . Foods, drinks and meals are best eaten with others and savored . Daily physical activity is important for overall good health . This could be strenuous exercise like running and aerobics . This could also be more leisurely activities such as walking, housework, yard-work, or taking the stairs . Moderation is the key; a balanced and healthy diet accommodates most foods and drinks . Consider portion sizes and frequency of consumption of certain foods   Meal Ideas & Options:  . Breakfast:  o Whole wheat toast or whole wheat English muffins with peanut butter & hard boiled egg o Steel cut oats topped with apples & cinnamon and skim milk  o Fresh fruit: banana, strawberries, melon, berries, peaches  o Smoothies: strawberries, bananas, greek yogurt, peanut butter o Low fat greek yogurt with blueberries and granola  o Egg white omelet with spinach and mushrooms o Breakfast couscous: whole wheat couscous, apricots, skim milk, cranberries  . Sandwiches:  o Hummus and grilled vegetables (peppers, zucchini, squash) on whole wheat bread   o Grilled chicken on whole wheat pita with lettuce, tomatoes, cucumbers or tzatziki  o Tuna salad on whole wheat bread: tuna salad made with greek yogurt, olives, red peppers, capers, green onions o Garlic rosemary lamb pita: lamb sauted with garlic, rosemary, salt & pepper; add lettuce, cucumber, greek yogurt to pita - flavor with lemon juice and black pepper  . Seafood:  o Mediterranean grilled salmon, seasoned with garlic, basil, parsley, lemon juice and  black pepper o Shrimp, lemon, and spinach whole-grain pasta salad made with low fat greek yogurt  o Seared scallops with lemon orzo  o Seared tuna steaks seasoned salt, pepper, coriander topped with tomato mixture of olives, tomatoes, olive oil, minced garlic,  parsley, green onions and cappers  . Meats:  o Herbed greek chicken salad with kalamata olives, cucumber, feta  o Red bell peppers stuffed with spinach, bulgur, lean ground beef (or lentils) & topped with feta   o Kebabs: skewers of chicken, tomatoes, onions, zucchini, squash  o [place] burgers: made with red onions, mint, dill, lemon juice, feta cheese topped with roasted red peppers . Vegetarian o Cucumber salad: cucumbers, artichoke hearts, celery, red onion, feta cheese, tossed in olive oil & lemon juice  o Hummus and whole grain pita points with a greek salad (lettuce, tomato, feta, olives, cucumbers, red onion) o Lentil soup with celery, carrots made with vegetable broth, garlic, salt and pepper  o Tabouli salad: parsley, bulgur, mint, scallions, cucumbers, tomato, radishes, lemon juice, olive oil, salt and pepper.      Hip Exercises Ask your health care provider which exercises are safe for you. Do exercises exactly as told by your health care provider and adjust them as directed. It is normal to feel mild stretching, pulling, tightness, or discomfort as you do these exercises, but you should stop right away if you feel sudden pain or your pain gets worse.Do not begin these exercises until told by your health care provider. STRETCHING AND RANGE OF MOTION EXERCISES  These exercises warm up your muscles and joints and improve the movement and flexibility of your hip. These exercises also help to relieve pain, numbness, and tingling. Exercise A: Hamstrings, Supine  1. Lie on your back. 2. Loop a belt or towel over the ball of your left / rightfoot. The ball of your foot is on the walking surface, right under your toes. 3. Straighten your left / rightknee and slowly pull on the belt to raise your leg.  Do not let your left / right knee bend while you do this.  Keep your other leg flat on the floor.  Raise the left / right leg until you feel a gentle stretch behind your left / right knee  or thigh. 4. Hold this position for __________ seconds. 5. Slowly return your leg to the starting position. Repeat __________ times. Complete this stretch __________ times a day. Exercise B: Hip Rotators  1. Lie on your back on a firm surface. 2. Hold your left / right knee with your left / right hand. Hold your ankle with your other hand. 3. Gently pull your left / right knee and rotate your lower leg toward your other shoulder.  Pull until you feel a stretch in your buttocks.  Keep your hips and shoulders firmly planted while you do this stretch. 4. Hold this position for __________ seconds. Repeat __________ times. Complete this stretch __________ times a day. Exercise C: V-Sit (Hamstrings and Adductors)  1. Sit on the floor with your legs extended in a large "V" shape. Keep your knees straight during this exercise. 2. Start with your head and chest upright, then bend at your waist to reach for your left foot (position A). You should feel a stretch in your right inner thigh. 3. Hold this position for __________ seconds. Then slowly return to the upright position. 4. Bend at your waist to reach forward (position B). You should feel a stretch behind both of your  thighs and knees. 5. Hold this position for __________ seconds. Then slowly return to the upright position. 6. Bend at your waist to reach for your right foot (position C). You should feel a stretch in your left inner thigh. 7. Hold this position for __________ seconds. Then slowly return to the upright position. Repeat __________ times. Complete this stretch __________ times a day. Exercise D: Lunge (Hip Flexors)  1. Place your left / right knee on the floor and bend your other knee so that is directly over your ankle. You should be half-kneeling. 2. Keep good posture with your head over your shoulders. 3. Tighten your buttocks to point your tailbone downward. This helps your back to keep from arching too much. 4. You should feel  a gentle stretch in the front of your left / right thigh and hip. If you do not feel any resistance, slightly slide your other foot forward and then slowly lunge forward so your knee once again lines up over your ankle. 5. Make sure your tailbone continues to point downward. 6. Hold this position for __________ seconds. Repeat __________ times. Complete this stretch __________ times a day. STRENGTHENING EXERCISES  These exercises build strength and endurance in your hip. Endurance is the ability to use your muscles for a long time, even after they get tired. Exercise E: Bridge (Hip Extensors)  1. Lie on your back on a firm surface with your knees bent and your feet flat on the floor. 2. Tighten your buttocks muscles and lift your bottom off the floor until the trunk of your body is level with your thighs.  Do not arch your back.  You should feel the muscles working in your buttocks and the back of your thighs. If you do not feel these muscles, slide your feet 1-2 inches (2.5-5 cm) farther away from your buttocks. 3. Hold this position for __________ seconds. 4. Slowly lower your hips to the starting position. 5. Let your muscles relax completely between repetitions. 6. If this exercise is too easy, try doing it with your arms crossed over your chest. Repeat __________ times. Complete this exercise __________ times a day. Exercise F: Straight Leg Raises - Hip Abductors  1. Lie on your side with your left / right leg in the top position. Lie so your head, shoulder, knee, and hip line up with each other. You may bend your bottom knee to help you balance. 2. Roll your hips slightly forward, so your hips are stacked directly over each other and your left / right knee is facing forward. 3. Leading with your heel, lift your top leg 4-6 inches (10-15 cm). You should feel the muscles in your outer hip lifting.  Do not let your foot drift forward.  Do not let your knee roll toward the  ceiling. 4. Hold this position for __________ seconds. 5. Slowly return to the starting position. 6. Let your muscles relax completely between repetitions. Repeat __________ times. Complete this exercise __________ times a day. Exercise G: Straight Leg Raises - Hip Adductors  1. Lie on your side with your left / right leg in the bottom position. Lie so your head, shoulder, knee, and hip line up. You may place your upper foot in front to help you balance. 2. Roll your hips slightly forward, so your hips are stacked directly over each other and your left / right knee is facing forward. 3. Tense the muscles in your inner thigh and lift your bottom leg 4-6 inches (10-15 cm).  4. Hold this position for __________ seconds. 5. Slowly return to the starting position. 6. Let your muscles relax completely between repetitions. Repeat __________ times. Complete this exercise __________ times a day. Exercise H: Straight Leg Raises - Quadriceps  1. Lie on your back with your left / right leg extended and your other knee bent. 2. Tense the muscles in the front of your left / right thigh. When you do this, you should see your kneecap slide up or see increased dimpling just above your knee. 3. Tighten these muscles even more and raise your leg 4-6 inches (10-15 cm) off the floor. 4. Hold this position for __________ seconds. 5. Keep these muscles tense as you lower your leg. 6. Relax the muscles slowly and completely between repetitions. Repeat __________ times. Complete this exercise __________ times a day. Exercise I: Hip Abductors, Standing 1. Tie one end of a rubber exercise band or tubing to a secure surface, such as a table or pole. 2. Loop the other end of the band or tubing around your left / right ankle. 3. Keeping your ankle with the band or tubing directly opposite of the secured end, step away until there is tension in the tubing or band. Hold onto a chair as needed for balance. 4. Lift your left  / right leg out to your side. While you do this:  Keep your back upright.  Keep your shoulders over your hips.  Keep your toes pointing forward.  Make sure to use your hip muscles to lift your leg. Do not "throw" your leg or tip your body to lift your leg. 5. Hold this position for __________ seconds. 6. Slowly return to the starting position. Repeat __________ times. Complete this exercise __________ times a day. Exercise J: Squats (Quadriceps) 1. Stand in a door frame so your feet and knees are in line with the frame. You may place your hands on the frame for balance. 2. Slowly bend your knees and lower your hips like you are going to sit in a chair.  Keep your lower legs in a straight-up-and-down position.  Do not let your hips go lower than your knees.  Do not bend your knees lower than told by your health care provider.  If your hip pain increases, do not bend as low. 3. Hold this position for ___________ seconds. 4. Slowly push with your legs to return to standing. Do not use your hands to pull yourself to standing. Repeat __________ times. Complete this exercise __________ times a day. This information is not intended to replace advice given to you by your health care provider. Make sure you discuss any questions you have with your health care provider. Document Released: 10/06/2005 Document Revised: 06/12/2016 Document Reviewed: 09/13/2015 Elsevier Interactive Patient Education  2017 [employer].

## 2016-11-09 NOTE — Progress Notes (Signed)
Patient: Melissa Cherry, Female    DOB: 15-Jul-1961, 56 y.o.   MRN: [id number] Visit Date: 11/09/2016  Today's Provider: Margaretann Loveless, PA-C   Chief Complaint  Patient presents with  . Annual Exam   Subjective:    Annual physical exam  Melissa Cherry is a 56 y.o. female who presents today for health maintenance and complete physical. She feels well. She reports exercising active with daily activities. She reports she is sleeping fairly well. 09/07/15 CPE 09/07/15 Pap-neg; HPV-neg 09/22/15 Mammogram-BI-BRADS 1 ----------------------------------------------------------------- Patient c/o URI symptoms reports that she has been around people diagnosed with flu (her daughter). Patient reports that she has been taking Mucinex DM and reports moderate improvement with symptoms.  Patient c/o left hip pain on and off for about 1 month. Patient reports she notices pain more with activity. Patient reports she has taken Advil as needed.   Review of Systems  Constitutional: Negative.   HENT: Positive for mouth sores and postnasal drip.   Eyes: Negative.   Respiratory: Positive for cough.   Cardiovascular: Negative.   Gastrointestinal: Negative.   Endocrine: Negative.   Genitourinary: Negative.   Musculoskeletal: Positive for arthralgias and gait problem.  Skin: Negative.   Allergic/Immunologic: Negative.   Neurological: Negative for dizziness, weakness, numbness and headaches.  Hematological: Negative.   Psychiatric/Behavioral: Negative.     Social History      She  reports that she quit smoking about 20 years ago. She has never used smokeless tobacco. She reports that she drinks alcohol. She reports that she does not use drugs.       Social History   Social History  . Marital status: Married    Spouse name: N/A  . Number of children: 2  . Years of education: N/A   Social History Main Topics  . Smoking status: Former Smoker    Quit date: 10/02/1996  . Smokeless tobacco:  Never Used  . Alcohol use Yes     Comment: Occasionally; 2-3 beers/week at most (during summer)  . Drug use: No  . Sexual activity: Not Asked   Other Topics Concern  . None   Social History Narrative  . None    Past Medical History:  Diagnosis Date  . Complication of anesthesia    too high of episural  . GERD (gastroesophageal reflux disease)   . Hypercholesteremia   . Hypothyroidism   . Morbid obesity (HCC)   . OSA (obstructive sleep apnea)      Patient Active Problem List   Diagnosis Date Noted  . Allergic rhinitis 09/01/2015  . Breathing-related sleep disorder 09/01/2015  . Dermoid inclusion cyst 09/01/2015  . Avitaminosis D 09/01/2015  . Hypothyroidism 03/08/2015  . Obesity (BMI 30-39.9) 12/17/2013  . History of Lap Roux-en-Y gastric bypass 04/14/13 05/02/2013  . OSA on CPAP 04/15/2013  . Hypercholesteremia 04/15/2013  . Fatty liver 04/15/2013  . Adiposity 01/08/2010  . Malaise and fatigue 01/06/2010  . Adaptation reaction 11/24/2009  . H/O contraceptive use 12/11/2008  . Family history of cardiovascular disease 12/02/2008  . History of tobacco use 12/02/2008  . Carbuncle and furuncle 11/25/2008  . CD (contact dermatitis) 11/25/2008  . History of methicillin resistant Staphylococcus aureus infection 11/25/2008  . Abnormal C-reactive protein 12/12/2007    Past Surgical History:  Procedure Laterality Date  . CESAREAN SECTION  2000, 2003  . DILATION AND CURETTAGE OF UTERUS  1999  . GANGLION CYST EXCISION Right 1992  . GASTRIC ROUX-EN-Y N/A  04/14/2013   Procedure: LAPAROSCOPIC ROUX-EN-Y GASTRIC BYPASS WITH UPPER ENDOSCOPY;  Surgeon: Atilano Ina, MD;  Location: [facility];  Service: General;  Laterality: N/A;  . TONSILLECTOMY  as child    Family History        Family Status  Relation Status  . Father Deceased  . Mother Deceased  . Sister Alive  . Brother Alive  . Sister Alive  . Daughter Alive  . Son Alive        Her family history includes Breast  cancer in her mother; Dementia in her mother; Diabetes in her father; Healthy in her brother and sister; Heart disease in her father; Hip fracture in her sister; Mental retardation in her sister.     Allergies  Allergen Reactions  . Tamiflu [Oseltamivir Phosphate] Rash     Current Outpatient Prescriptions:  .  levothyroxine (SYNTHROID, LEVOTHROID) 25 MCG tablet, Take 1 tablet (25 mcg total) by mouth daily before breakfast., Disp: 30 tablet, Rfl: 1 .  loratadine (CLARITIN) 10 MG tablet, Take 10 mg by mouth daily as needed for allergies. , Disp: , Rfl:    Patient Care Team: Margaretann Loveless, PA-C as PCP - General (Family Medicine) Loree Fee Himmelrich, RD (Inactive) as Dietitian (Bariatrics)      Objective:   Vitals: BP 110/70 (BP Location: Left Arm, Patient Position: Sitting, Cuff Size: Large)   Pulse 62   Temp 98.2 F (36.8 C) (Oral)   Resp 16   Ht 5' 1.5" (1.562 m)   Wt 194 lb (88 kg)   SpO2 97%   BMI 36.06 kg/m    Physical Exam  Constitutional: She is oriented to person, place, and time. She appears well-developed and well-nourished. No distress.  HENT:  Head: Normocephalic and atraumatic.  Right Ear: Hearing, tympanic membrane, external ear and ear canal normal.  Left Ear: Hearing, tympanic membrane, external ear and ear canal normal.  Nose: Nose normal.  Mouth/Throat: Uvula is midline, oropharynx is clear and moist and mucous membranes are normal. No oropharyngeal exudate.  Eyes: Conjunctivae and EOM are normal. Pupils are equal, round, and reactive to light. Right eye exhibits no discharge. Left eye exhibits no discharge. No scleral icterus.  Neck: Normal range of motion. Neck supple. No JVD present. Carotid bruit is not present. No tracheal deviation present. No thyromegaly present.  Cardiovascular: Normal rate, regular rhythm, normal heart sounds and intact distal pulses.  Exam reveals no gallop and no friction rub.   No murmur heard. Pulmonary/Chest: Effort normal  and breath sounds normal. No respiratory distress. She has no wheezes. She has no rales. She exhibits no tenderness. Right breast exhibits no inverted nipple, no mass, no nipple discharge, no skin change and no tenderness. Left breast exhibits no inverted nipple, no mass, no nipple discharge, no skin change and no tenderness. Breasts are symmetrical.  Abdominal: Soft. Bowel sounds are normal. She exhibits no distension and no mass. There is no tenderness. There is no rebound and no guarding. Hernia confirmed negative in the right inguinal area and confirmed negative in the left inguinal area.  Genitourinary: Rectum normal, vagina normal and uterus normal. No breast swelling, tenderness, discharge or bleeding. Pelvic exam was performed with patient supine. There is no rash, tenderness, lesion or injury on the right labia. There is no rash, tenderness, lesion or injury on the left labia. Cervix exhibits no motion tenderness, no discharge and no friability. Right adnexum displays no mass, no tenderness and no fullness. Left adnexum displays no  mass, no tenderness and no fullness. No erythema, tenderness or bleeding in the vagina. No signs of injury around the vagina. No vaginal discharge found.  Musculoskeletal: Normal range of motion. She exhibits no edema.       Left hip: She exhibits tenderness (over hip flexor tendons). She exhibits normal range of motion, normal strength, no bony tenderness and no swelling.  Lymphadenopathy:    She has no cervical adenopathy.       Right: No inguinal adenopathy present.       Left: No inguinal adenopathy present.  Neurological: She is alert and oriented to person, place, and time. She has normal reflexes. No cranial nerve deficit. Coordination normal.  Skin: Skin is warm and dry. No rash noted. She is not diaphoretic.  Psychiatric: She has a normal mood and affect. Her behavior is normal. Judgment and thought content normal.  Vitals reviewed.    Depression  Screen PHQ 2/9 Scores 11/09/2016  PHQ - 2 Score 0      Assessment & Plan:     Routine Health Maintenance and Physical Exam  Exercise Activities and Dietary recommendations Goals    None      Immunization History  Administered Date(s) Administered  . H1N1 08/20/2008  . Influenza Split 07/09/2009, 07/21/2010, 07/26/2011, 07/30/2012  . Tdap 01/18/2006    Health Maintenance  Topic Date Due  . Hepatitis C Screening  Sep 20, 1961  . HIV Screening  07/14/1975  . COLONOSCOPY  07/13/2010  . TETANUS/TDAP  01/19/2016  . INFLUENZA VACCINE  07/09/2017 (Originally 05/02/2016)  . MAMMOGRAM  09/21/2017  . PAP SMEAR  09/06/2018     Discussed health benefits of physical activity, and encouraged her to engage in regular exercise appropriate for her age and condition.    1. Annual physical exam Normal physical exam today. Will check labs as below and f/u pending lab results. If labs are stable and WNL she will not need to have these rechecked for one year at her next annual physical exam. She is to call the office in the meantime if she has any acute issue, questions or concerns. - CBC w/Diff/Platelet - Comprehensive metabolic panel  2. Breast cancer screening Breast exam today was normal. There is family history of breast cancer in her mother. She does perform regular self breast exams. Mammogram was ordered as below. [facility] patient so she may schedule her mammogram at her convenience. - Mammogram Digital Screening; Future  3. Family history of breast cancer Mother.  4. Cervical cancer screening Pap collected today. Will send as below and f/u pending results. - Pap IG and HPV (high risk) DNA detection ([facility])  5. Colon cancer screening OC lite collected today and was negative. - IFOBT POC (occult bld, rslt in office)  6. Family history of diabetes mellitus Will check labs as below and f/u pending results. - Hemoglobin  A1c  7. Hypothyroidism, unspecified type Will check labs as below and f/u pending results. - TSH  8. Hypercholesteremia Will check labs as below and f/u pending results. - Lipid panel  9. Family history of cardiovascular disease Will check labs as below and f/u pending results. - Hemoglobin A1c  10. Fatty liver Will check labs as below and f/u pending results. - Comprehensive metabolic panel  11. Hip flexor tendinitis, left Continue advil prn. Advised of stretches and strengthening exercises.   12. Plantar fascial fibromatosis of left foot Stable.   13. Need for hepatitis C screening  test - Hepatitis C antibody screen  14. Screening for HIV without presence of risk factors - HIV antibody  15. Need for TD vaccine Tetanus booster Vaccine given to patient without complications. Patient sat for 15 minutes after administration and was tolerated well without adverse effects. - Td : Tetanus/diphtheria >7yo Preservative  free  --------------------------------------------------------------------    Margaretann Loveless, PA-C  [facility]

## 2016-11-10 ENCOUNTER — Other Ambulatory Visit: Payer: Self-pay

## 2016-11-10 ENCOUNTER — Telehealth: Payer: Self-pay

## 2016-11-10 DIAGNOSIS — E039 Hypothyroidism, unspecified: Secondary | ICD-10-CM

## 2016-11-10 LAB — COMPREHENSIVE METABOLIC PANEL
ALBUMIN: 4.2 g/dL (ref 3.5–5.5)
ALK PHOS: 119 IU/L — AB (ref 39–117)
ALT: 13 IU/L (ref 0–32)
AST: 21 IU/L (ref 0–40)
Albumin/Globulin Ratio: 1.9 (ref 1.2–2.2)
BUN/Creatinine Ratio: 20 (ref 9–23)
BUN: 11 mg/dL (ref 6–24)
Bilirubin Total: 0.4 mg/dL (ref 0.0–1.2)
CO2: 28 mmol/L (ref 18–29)
CREATININE: 0.55 mg/dL — AB (ref 0.57–1.00)
Calcium: 8.9 mg/dL (ref 8.7–10.2)
Chloride: 103 mmol/L (ref 96–106)
GFR calc Af Amer: 121 mL/min/{1.73_m2} (ref >59)
GFR calc non Af Amer: 105 mL/min/{1.73_m2} (ref >59)
Globulin, Total: 2.2 g/dL (ref 1.5–4.5)
Glucose: 85 mg/dL (ref 65–99)
Potassium: 4.3 mmol/L (ref 3.5–5.2)
Sodium: 144 mmol/L (ref 134–144)
Total Protein: 6.4 g/dL (ref 6.0–8.5)

## 2016-11-10 LAB — CBC WITH DIFFERENTIAL/PLATELET
BASOS ABS: 0 10*3/uL (ref 0.0–0.2)
Basos: 1 %
EOS (ABSOLUTE): 0.2 10*3/uL (ref 0.0–0.4)
Eos: 4 %
HEMOGLOBIN: 12.9 g/dL (ref 11.1–15.9)
Hematocrit: 38.9 % (ref 34.0–46.6)
IMMATURE GRANULOCYTES: 0 %
Immature Grans (Abs): 0 10*3/uL (ref 0.0–0.1)
LYMPHS ABS: 1.8 10*3/uL (ref 0.7–3.1)
Lymphs: 40 %
MCH: 29.7 pg (ref 26.6–33.0)
MCHC: 33.2 g/dL (ref 31.5–35.7)
MCV: 90 fL (ref 79–97)
MONOCYTES: 8 %
Monocytes Absolute: 0.3 10*3/uL (ref 0.1–0.9)
NEUTROS PCT: 47 %
Neutrophils Absolute: 2.2 10*3/uL (ref 1.4–7.0)
Platelets: 207 10*3/uL (ref 150–379)
RBC: 4.34 x10E6/uL (ref 3.77–5.28)
RDW: 13.6 % (ref 12.3–15.4)
WBC: 4.5 10*3/uL (ref 3.4–10.8)

## 2016-11-10 LAB — HEMOGLOBIN A1C
Est. average glucose Bld gHb Est-mCnc: 108 mg/dL
HEMOGLOBIN A1C: 5.4 % (ref 4.8–5.6)

## 2016-11-10 LAB — LIPID PANEL
CHOL/HDL RATIO: 3.7 ratio (ref 0.0–4.4)
Cholesterol, Total: 191 mg/dL (ref 100–199)
HDL: 52 mg/dL (ref >39)
LDL CALC: 124 mg/dL — AB (ref 0–99)
Triglycerides: 73 mg/dL (ref 0–149)
VLDL CHOLESTEROL CAL: 15 mg/dL (ref 5–40)

## 2016-11-10 LAB — HIV ANTIBODY (ROUTINE TESTING W REFLEX): HIV Screen 4th Generation wRfx: NONREACTIVE

## 2016-11-10 LAB — TSH: TSH: 2.62 u[IU]/mL (ref 0.450–4.500)

## 2016-11-10 LAB — HEPATITIS C ANTIBODY: Hep C Virus Ab: <0.1 s/co ratio (ref 0.0–0.9)

## 2016-11-10 MED ORDER — LEVOTHYROXINE SODIUM 25 MCG PO TABS: 25.0000 ug | ORAL_TABLET | Freq: Every day | ORAL | 5 refills | Status: DC

## 2016-11-10 NOTE — Telephone Encounter (Signed)
Pharmacy requesting refill.

## 2016-11-10 NOTE — Telephone Encounter (Signed)
lmtcb Emily Drozdowski, CMA  

## 2016-11-10 NOTE — Telephone Encounter (Signed)
-----   Message from Margaretann LovelessJennifer M Burnette, [place]PA-C sent at 11/10/2016  2:03 PM EST ----- All labs are within normal limits and stable.  Cholesterol and thyroid have much improved. Thanks! -JB

## 2016-11-13 LAB — PAP IG AND HPV HIGH-RISK
HPV, HIGH-RISK: NEGATIVE
PAP Smear Comment: 0

## 2016-11-13 NOTE — Telephone Encounter (Signed)
Advised pt of lab results. Pt verbally acknowledges understanding. Devell Parkerson Drozdowski, CMA   

## 2016-11-14 ENCOUNTER — Telehealth: Payer: Self-pay

## 2016-11-14 DIAGNOSIS — Z1231 Encounter for screening mammogram for malignant neoplasm of breast: Secondary | ICD-10-CM | POA: Diagnosis not present

## 2016-11-14 DIAGNOSIS — R87610 Atypical squamous cells of undetermined significance on cytologic smear of cervix (ASC-US): Secondary | ICD-10-CM

## 2016-11-14 LAB — IFOBT (OCCULT BLOOD): IFOBT: NEGATIVE

## 2016-11-14 NOTE — Telephone Encounter (Signed)
Referral placed.

## 2016-11-14 NOTE — Telephone Encounter (Signed)
-----   Message from Margaretann LovelessJennifer M Burnette, [place]PA-C sent at 11/13/2016  5:21 PM EST ----- Pap is abnormal for atypical squamous cells of undetermined significance but is HPV negative. This is most common abnormal finding. Ok to recheck in one year. If patient desires can refer for further eval for abnormal pap to GYN, but ok for close monitoring.

## 2016-11-14 NOTE — Telephone Encounter (Signed)
Patient has been advised she states that she would like to follow up with Gyn for further evaluation. KW

## 2016-11-15 ENCOUNTER — Telehealth: Payer: Self-pay

## 2016-11-15 NOTE — Telephone Encounter (Signed)
LMTCB-RE: Mammogram Results-Bi-RADS Category 1: Negative. Follow-up in one year.  Thanks,  -Lavene Penagos

## 2016-11-15 NOTE — Telephone Encounter (Signed)
Patient advised.

## 2016-11-17 ENCOUNTER — Encounter: Payer: Self-pay | Admitting: Physician Assistant

## 2016-12-25 ENCOUNTER — Telehealth: Payer: Self-pay | Admitting: Physician Assistant

## 2016-12-25 NOTE — Telephone Encounter (Signed)
[id number] 

## 2016-12-25 NOTE — Telephone Encounter (Signed)
Pt was referred in Feb to GYN for abnormal pap.I spoke with pt and was told she would call me with name of doctor she would like to see,I had not heard back from pt so I called  and left message 12/18/16 & 12/21/16 to f/u on referral.She has not returned phone calls

## 2016-12-25 NOTE — Telephone Encounter (Signed)
Pt did return call today and [facility]

## 2016-12-25 NOTE — Telephone Encounter (Signed)
Ok thank you 

## 2016-12-25 NOTE — Telephone Encounter (Signed)
Ok perfect. Thanks  °

## 2017-02-05 DIAGNOSIS — R87610 Atypical squamous cells of undetermined significance on cytologic smear of cervix (ASC-US): Secondary | ICD-10-CM | POA: Diagnosis not present

## 2017-09-27 ENCOUNTER — Other Ambulatory Visit: Payer: Self-pay | Admitting: *Deleted

## 2017-09-27 ENCOUNTER — Ambulatory Visit (INDEPENDENT_AMBULATORY_CARE_PROVIDER_SITE_OTHER): Payer: BLUE CROSS/BLUE SHIELD | Admitting: Physician Assistant

## 2017-09-27 DIAGNOSIS — Z23 Encounter for immunization: Secondary | ICD-10-CM

## 2017-09-27 DIAGNOSIS — E039 Hypothyroidism, unspecified: Secondary | ICD-10-CM

## 2017-09-27 MED ORDER — LEVOTHYROXINE SODIUM 25 MCG PO TABS: 25.0000 ug | ORAL_TABLET | Freq: Every day | ORAL | 5 refills | Status: DC

## 2017-09-27 NOTE — Progress Notes (Signed)
Patient here for Influenza Vaccine only. Patient tolerated vaccine well.

## 2017-09-27 NOTE — Telephone Encounter (Signed)
Patient states she is out of medication

## 2017-11-12 ENCOUNTER — Encounter: Payer: Self-pay | Admitting: Physician Assistant

## 2017-11-12 ENCOUNTER — Ambulatory Visit (INDEPENDENT_AMBULATORY_CARE_PROVIDER_SITE_OTHER): Payer: BLUE CROSS/BLUE SHIELD | Admitting: Physician Assistant

## 2017-11-12 VITALS — BP 120/80 | HR 73 | Temp 98.2°F | Resp 16 | Ht 62.0 in | Wt 196.8 lb

## 2017-11-12 DIAGNOSIS — E7800 Pure hypercholesterolemia, unspecified: Secondary | ICD-10-CM | POA: Diagnosis not present

## 2017-11-12 DIAGNOSIS — Z0000 Encounter for general adult medical examination without abnormal findings: Secondary | ICD-10-CM

## 2017-11-12 DIAGNOSIS — Z833 Family history of diabetes mellitus: Secondary | ICD-10-CM

## 2017-11-12 DIAGNOSIS — Z8249 Family history of ischemic heart disease and other diseases of the circulatory system: Secondary | ICD-10-CM

## 2017-11-12 DIAGNOSIS — Z1231 Encounter for screening mammogram for malignant neoplasm of breast: Secondary | ICD-10-CM | POA: Diagnosis not present

## 2017-11-12 DIAGNOSIS — Z124 Encounter for screening for malignant neoplasm of cervix: Secondary | ICD-10-CM

## 2017-11-12 DIAGNOSIS — E039 Hypothyroidism, unspecified: Secondary | ICD-10-CM | POA: Diagnosis not present

## 2017-11-12 DIAGNOSIS — J301 Allergic rhinitis due to pollen: Secondary | ICD-10-CM

## 2017-11-12 DIAGNOSIS — R87619 Unspecified abnormal cytological findings in specimens from cervix uteri: Secondary | ICD-10-CM

## 2017-11-12 MED ORDER — LEVOTHYROXINE SODIUM 25 MCG PO TABS: 25.0000 ug | ORAL_TABLET | Freq: Every day | ORAL | 3 refills | Status: DC

## 2017-11-12 MED ORDER — PSEUDOEPHEDRINE-GUAIFENESIN ER 60-600 MG PO TB12: 1.0000 | ORAL_TABLET | Freq: Two times a day (BID) | ORAL | 5 refills | Status: DC

## 2017-11-12 NOTE — Patient Instructions (Signed)
Health Maintenance for Postmenopausal Women Menopause is a normal process in which your reproductive ability comes to an end. This process happens gradually over a span of months to years, usually between the ages of 22 and 9. Menopause is complete when you have missed 12 consecutive menstrual periods. It is important to talk with your health care provider about some of the most common conditions that affect postmenopausal women, such as heart disease, cancer, and bone loss (osteoporosis). Adopting a healthy lifestyle and getting preventive care can help to promote your health and wellness. Those actions can also lower your chances of developing some of these common conditions. What should I know about menopause? During menopause, you may experience a number of symptoms, such as:  Moderate-to-severe hot flashes.  Night sweats.  Decrease in sex drive.  Mood swings.  Headaches.  Tiredness.  Irritability.  Memory problems.  Insomnia.  Choosing to treat or not to treat menopausal changes is an individual decision that you make with your health care provider. What should I know about hormone replacement therapy and supplements? Hormone therapy products are effective for treating symptoms that are associated with menopause, such as hot flashes and night sweats. Hormone replacement carries certain risks, especially as you become older. If you are thinking about using estrogen or estrogen with progestin treatments, discuss the benefits and risks with your health care provider. What should I know about heart disease and stroke? Heart disease, heart attack, and stroke become more likely as you age. This may be due, in part, to the hormonal changes that your body experiences during menopause. These can affect how your body processes dietary fats, triglycerides, and cholesterol. Heart attack and stroke are both medical emergencies. There are many things that you can do to help prevent heart disease  and stroke:  Have your blood pressure checked at least every 1-2 years. High blood pressure causes heart disease and increases the risk of stroke.  If you are 53-22 years old, ask your health care provider if you should take aspirin to prevent a heart attack or a stroke.  Do not use any tobacco products, including cigarettes, chewing tobacco, or electronic cigarettes. If you need help quitting, ask your health care provider.  It is important to eat a healthy diet and maintain a healthy weight. ? Be sure to include plenty of vegetables, fruits, low-fat dairy products, and lean protein. ? Avoid eating foods that are high in solid fats, added sugars, or salt (sodium).  Get regular exercise. This is one of the most important things that you can do for your health. ? Try to exercise for at least 150 minutes each week. The type of exercise that you do should increase your heart rate and make you sweat. This is known as moderate-intensity exercise. ? Try to do strengthening exercises at least twice each week. Do these in addition to the moderate-intensity exercise.  Know your numbers.Ask your health care provider to check your cholesterol and your blood glucose. Continue to have your blood tested as directed by your health care provider.  What should I know about cancer screening? There are several types of cancer. Take the following steps to reduce your risk and to catch any cancer development as early as possible. Breast Cancer  Practice breast self-awareness. ? This means understanding how your breasts normally appear and feel. ? It also means doing regular breast self-exams. Let your health care provider know about any changes, no matter how small.  If you are 40  or older, have a clinician do a breast exam (clinical breast exam or CBE) every year. Depending on your age, family history, and medical history, it may be recommended that you also have a yearly breast X-Sen (mammogram).  If you  have a family history of breast cancer, talk with your health care provider about genetic screening.  If you are at high risk for breast cancer, talk with your health care provider about having an MRI and a mammogram every year.  Breast cancer (BRCA) gene test is recommended for women who have family members with BRCA-related cancers. Results of the assessment will determine the need for genetic counseling and BRCA1 and for BRCA2 testing. BRCA-related cancers include these types: ? Breast. This occurs in males or females. ? Ovarian. ? Tubal. This may also be called fallopian tube cancer. ? Cancer of the abdominal or pelvic lining (peritoneal cancer). ? Prostate. ? Pancreatic.  Cervical, Uterine, and Ovarian Cancer Your health care provider may recommend that you be screened regularly for cancer of the pelvic organs. These include your ovaries, uterus, and vagina. This screening involves a pelvic exam, which includes checking for microscopic changes to the surface of your cervix (Pap test).  For women ages 21-65, health care providers may recommend a pelvic exam and a Pap test every three years. For women ages 79-65, they may recommend the Pap test and pelvic exam, combined with testing for human papilloma virus (HPV), every five years. Some types of HPV increase your risk of cervical cancer. Testing for HPV may also be done on women of any age who have unclear Pap test results.  Other health care providers may not recommend any screening for nonpregnant women who are considered low risk for pelvic cancer and have no symptoms. Ask your health care provider if a screening pelvic exam is right for you.  If you have had past treatment for cervical cancer or a condition that could lead to cancer, you need Pap tests and screening for cancer for at least 20 years after your treatment. If Pap tests have been discontinued for you, your risk factors (such as having a new sexual partner) need to be  reassessed to determine if you should start having screenings again. Some women have medical problems that increase the chance of getting cervical cancer. In these cases, your health care provider may recommend that you have screening and Pap tests more often.  If you have a family history of uterine cancer or ovarian cancer, talk with your health care provider about genetic screening.  If you have vaginal bleeding after reaching menopause, tell your health care provider.  There are currently no reliable tests available to screen for ovarian cancer.  Lung Cancer Lung cancer screening is recommended for adults 69-62 years old who are at high risk for lung cancer because of a history of smoking. A yearly low-dose CT scan of the lungs is recommended if you:  Currently smoke.  Have a history of at least 30 pack-years of smoking and you currently smoke or have quit within the past 15 years. A pack-year is smoking an average of one pack of cigarettes per day for one year.  Yearly screening should:  Continue until it has been 15 years since you quit.  Stop if you develop a health problem that would prevent you from having lung cancer treatment.  Colorectal Cancer  This type of cancer can be detected and can often be prevented.  Routine colorectal cancer screening usually begins at  age 42 and continues through age 45.  If you have risk factors for colon cancer, your health care provider may recommend that you be screened at an earlier age.  If you have a family history of colorectal cancer, talk with your health care provider about genetic screening.  Your health care provider may also recommend using home test kits to check for hidden blood in your stool.  A small camera at the end of a tube can be used to examine your colon directly (sigmoidoscopy or colonoscopy). This is done to check for the earliest forms of colorectal cancer.  Direct examination of the colon should be repeated every  5-10 years until age 71. However, if early forms of precancerous polyps or small growths are found or if you have a family history or genetic risk for colorectal cancer, you may need to be screened more often.  Skin Cancer  Check your skin from head to toe regularly.  Monitor any moles. Be sure to tell your health care provider: ? About any new moles or changes in moles, especially if there is a change in a mole's shape or color. ? If you have a mole that is larger than the size of a pencil eraser.  If any of your family members has a history of skin cancer, especially at a young age, talk with your health care provider about genetic screening.  Always use sunscreen. Apply sunscreen liberally and repeatedly throughout the day.  Whenever you are outside, protect yourself by wearing long sleeves, pants, a wide-brimmed hat, and sunglasses.  What should I know about osteoporosis? Osteoporosis is a condition in which bone destruction happens more quickly than new bone creation. After menopause, you may be at an increased risk for osteoporosis. To help prevent osteoporosis or the bone fractures that can happen because of osteoporosis, the following is recommended:  If you are 46-71 years old, get at least 1,000 mg of calcium and at least 600 mg of vitamin D per day.  If you are older than age 55 but younger than age 65, get at least 1,200 mg of calcium and at least 600 mg of vitamin D per day.  If you are older than age 54, get at least 1,200 mg of calcium and at least 800 mg of vitamin D per day.  Smoking and excessive alcohol intake increase the risk of osteoporosis. Eat foods that are rich in calcium and vitamin D, and do weight-bearing exercises several times each week as directed by your health care provider. What should I know about how menopause affects my mental health? Depression may occur at any age, but it is more common as you become older. Common symptoms of depression  include:  Low or sad mood.  Changes in sleep patterns.  Changes in appetite or eating patterns.  Feeling an overall lack of motivation or enjoyment of activities that you previously enjoyed.  Frequent crying spells.  Talk with your health care provider if you think that you are experiencing depression. What should I know about immunizations? It is important that you get and maintain your immunizations. These include:  Tetanus, diphtheria, and pertussis (Tdap) booster vaccine.  Influenza every year before the flu season begins.  Pneumonia vaccine.  Shingles vaccine.  Your health care provider may also recommend other immunizations. This information is not intended to replace advice given to you by your health care provider. Make sure you discuss any questions you have with your health care provider. Document Released: 11/10/2005  Document Revised: 04/07/2016 Document Reviewed: 06/22/2015 Elsevier Interactive Patient Education  2018 [employer] Inc[employer]  

## 2017-11-12 NOTE — Progress Notes (Signed)
Patient: Melissa Cherry, Female    DOB: [birth year], 57 y.o.   MRN: [id number] Visit Date: 11/12/2017  Today's Provider: Margaretann LovelessJennifer M Daruis Swaim, PA-C   Chief Complaint  Patient presents with  . Annual Exam   Subjective:    Annual physical exam Melissa Cherry is a 57 y.o. female who presents today for health maintenance and complete physical. She feels well. She reports exercising. She reports she is sleeping well.  CPE:11/09/2016 Pap:11/09/16 Abnormal HPV-Neg Mammogram:11/14/16 BI-RADS 1 IFOBT:11/09/16-Neg -----------------------------------------------------------------   Review of Systems  Constitutional: Negative.   HENT: Negative.   Eyes: Negative.   Respiratory: Negative.   Cardiovascular: Negative.   Gastrointestinal: Negative.   Endocrine: Negative.   Genitourinary: Negative.   Musculoskeletal: Negative.   Skin: Negative.   Allergic/Immunologic: Negative.   Neurological: Negative.   Hematological: Negative.   Psychiatric/Behavioral: Negative.     Social History      She  reports that she quit smoking about 21 years ago. she has never used smokeless tobacco. She reports that she drinks alcohol. She reports that she does not use drugs.       Social History   Socioeconomic History  . Marital status: Married    Spouse name: None  . Number of children: 2  . Years of education: None  . Highest education level: None  Social Needs  . Financial resource strain: None  . Food insecurity - worry: None  . Food insecurity - inability: None  . Transportation needs - medical: None  . Transportation needs - non-medical: None  Occupational History  . None  Tobacco Use  . Smoking status: Former Smoker    Last attempt to quit: 10/02/1996    Years since quitting: 21.1  . Smokeless tobacco: Never Used  Substance and Sexual Activity  . Alcohol use: Yes    Comment: Occasionally; 2-3 beers/week at most (during summer)  . Drug use: No  . Sexual activity: None  Other  Topics Concern  . None  Social History Narrative  . None    Past Medical History:  Diagnosis Date  . Complication of anesthesia    too high of episural  . GERD (gastroesophageal reflux disease)   . Hypercholesteremia   . Hypothyroidism   . Morbid obesity (HCC)   . OSA (obstructive sleep apnea)      Patient Active Problem List   Diagnosis Date Noted  . Hip flexor tendinitis, left 11/09/2016  . Plantar fascial fibromatosis of left foot 11/09/2016  . Allergic rhinitis 09/01/2015  . Breathing-related sleep disorder 09/01/2015  . Dermoid inclusion cyst 09/01/2015  . Avitaminosis D 09/01/2015  . Hypothyroidism 03/08/2015  . Obesity (BMI 30-39.9) 12/17/2013  . History of Lap Roux-en-Y gastric bypass 04/14/13 05/02/2013  . OSA on CPAP 04/15/2013  . Hypercholesteremia 04/15/2013  . Fatty liver 04/15/2013  . Adiposity 01/08/2010  . Malaise and fatigue 01/06/2010  . Adaptation reaction 11/24/2009  . H/O contraceptive use 12/11/2008  . Family history of cardiovascular disease 12/02/2008  . History of tobacco use 12/02/2008  . Carbuncle and furuncle 11/25/2008  . CD (contact dermatitis) 11/25/2008  . History of methicillin resistant Staphylococcus aureus infection 11/25/2008  . Abnormal C-reactive protein 12/12/2007    Past Surgical History:  Procedure Laterality Date  . CESAREAN SECTION  2000, 2003  . DILATION AND CURETTAGE OF UTERUS  1999  . GANGLION CYST EXCISION Right 1992  . GASTRIC ROUX-EN-Y N/A 04/14/2013   Procedure: LAPAROSCOPIC ROUX-EN-Y GASTRIC BYPASS WITH UPPER  ENDOSCOPY;  Surgeon: Atilano Ina, MD;  Location: [facility];  Service: General;  Laterality: N/A;  . TONSILLECTOMY  as child    Family History        Family Status  Relation Name Status  . Father  Deceased  . Mother  Deceased  . Sister  Alive  . Brother  Alive  . Sister  Alive  . Daughter  Alive  . Son  Alive        Her family history includes Breast cancer in her mother; Dementia in her mother;  Diabetes in her father; Healthy in her brother and sister; Heart disease in her father; Hip fracture in her sister; Mental retardation in her sister.      Allergies  Allergen Reactions  . Tamiflu [Oseltamivir Phosphate] Rash     Current Outpatient Medications:  .  levothyroxine (SYNTHROID, LEVOTHROID) 25 MCG tablet, Take 1 tablet (25 mcg total) by mouth daily before breakfast., Disp: 30 tablet, Rfl: 5 .  loratadine (CLARITIN) 10 MG tablet, Take 10 mg by mouth daily as needed for allergies. , Disp: , Rfl:    Patient Care Team: Margaretann Loveless, PA-C as PCP - General (Family Medicine) Himmelrich, Loree Fee, RD (Inactive) as Dietitian (Bariatrics)      Objective:   Vitals: BP 120/80 (BP Location: Left Arm, Patient Position: Sitting, Cuff Size: Normal)   Pulse 73   Temp 98.2 F (36.8 C) (Oral)   Resp 16   Ht 5\' 2"  (1.575 m)   Wt 196 lb 12.8 oz (89.3 kg)   SpO2 99%   BMI 36.00 kg/m    Physical Exam  Constitutional: She is oriented to person, place, and time. She appears well-developed and well-nourished. No distress.  HENT:  Head: Normocephalic and atraumatic.  Right Ear: Hearing, tympanic membrane, external ear and ear canal normal.  Left Ear: Hearing, tympanic membrane, external ear and ear canal normal.  Nose: Nose normal.  Mouth/Throat: Uvula is midline, oropharynx is clear and moist and mucous membranes are normal. No oropharyngeal exudate.  Eyes: Conjunctivae and EOM are normal. Pupils are equal, round, and reactive to light. Right eye exhibits no discharge. Left eye exhibits no discharge. No scleral icterus.  Neck: Normal range of motion. Neck supple. No JVD present. Carotid bruit is not present. No tracheal deviation present. No thyromegaly present.  Cardiovascular: Normal rate, regular rhythm, normal heart sounds and intact distal pulses. Exam reveals no gallop and no friction rub.  No murmur heard. Pulmonary/Chest: Effort normal and breath sounds normal. No  respiratory distress. She has no wheezes. She has no rales. She exhibits no tenderness. Right breast exhibits no inverted nipple, no mass, no nipple discharge, no skin change and no tenderness. Left breast exhibits no inverted nipple, no mass, no nipple discharge, no skin change and no tenderness. Breasts are symmetrical.  Abdominal: Soft. Bowel sounds are normal. She exhibits no distension and no mass. There is no tenderness. There is no rebound and no guarding. Hernia confirmed negative in the right inguinal area and confirmed negative in the left inguinal area.  Genitourinary: Rectum normal, vagina normal and uterus normal. No breast swelling, tenderness, discharge or bleeding. Pelvic exam was performed with patient supine. There is no rash, tenderness, lesion or injury on the right labia. There is no rash, tenderness, lesion or injury on the left labia. Cervix exhibits no motion tenderness, no discharge and no friability. Right adnexum displays no mass, no tenderness and no fullness. Left adnexum displays no mass,  no tenderness and no fullness. No erythema, tenderness or bleeding in the vagina. No signs of injury around the vagina. No vaginal discharge found.  Musculoskeletal: Normal range of motion. She exhibits no edema or tenderness.  Lymphadenopathy:    She has no cervical adenopathy.       Right: No inguinal adenopathy present.       Left: No inguinal adenopathy present.  Neurological: She is alert and oriented to person, place, and time. She has normal reflexes. No cranial nerve deficit. Coordination normal.  Skin: Skin is warm and dry. No rash noted. She is not diaphoretic.  Psychiatric: She has a normal mood and affect. Her behavior is normal. Judgment and thought content normal.  Vitals reviewed.    Depression Screen PHQ 2/9 Scores 11/09/2016  PHQ - 2 Score 0      Assessment & Plan:     Routine Health Maintenance and Physical Exam  Exercise Activities and Dietary  recommendations Goals    None      Immunization History  Administered Date(s) Administered  . H1N1 08/20/2008  . Influenza Split 07/09/2009, 07/21/2010, 07/26/2011, 07/30/2012  . Influenza,inj,Quad PF,6+ Mos 09/27/2017  . Td 11/09/2016  . Tdap 01/18/2006    Health Maintenance  Topic Date Due  . COLONOSCOPY  07/13/2010  . MAMMOGRAM  11/14/2017  . PAP SMEAR  11/10/2019  . TETANUS/TDAP  11/09/2026  . INFLUENZA VACCINE  Completed  . Hepatitis C Screening  Completed  . HIV Screening  Completed     Discussed health benefits of physical activity, and encouraged her to engage in regular exercise appropriate for her age and condition.    1. Annual physical exam Normal physical exam today. Will check labs as below and f/u pending lab results. If labs are stable and WNL she will not need to have these rechecked for one year at her next annual physical exam. She is to call the office in the meantime if she has any acute issue, questions or concerns. - CBC with Differential/Platelet - Comprehensive metabolic panel  2. Cervical cancer screening Pap collected today. Will send as below and f/u pending results. - Pap IG w/ reflex to HPV when ASC-U  3. Encounter for screening mammogram for breast cancer Due at [facility].   4. Abnormal cervical Papanicolaou smear, unspecified abnormal pap finding Pap last year was ASCUS but negative HPV. Will repeat pap.  - Pap IG w/ reflex to HPV when ASC-U  5. Hypothyroidism, unspecified type Stable. Diagnosis pulled for medication refill. Continue current medical treatment plan. Will check labs as below and f/u pending results. - TSH - levothyroxine (SYNTHROID, LEVOTHROID) 25 MCG tablet; Take 1 tablet (25 mcg total) by mouth daily before breakfast.  Dispense: 90 tablet; Refill: 3  6. Hypercholesteremia Diet controlled. Will check labs as below and f/u pending results. - Lipid panel  7. Family history of cardiovascular disease Will  check labs as below and f/u pending results. - Lipid panel  8. Family history of diabetes mellitus Will check labs as below and f/u pending results. - Hemoglobin A1c  9. Non-seasonal allergic rhinitis due to pollen Stable. Diagnosis pulled for medication refill. Continue current medical treatment plan. - pseudoephedrine-guaifenesin (MUCINEX D) 60-600 MG 12 hr tablet; Take 1 tablet by mouth every 12 (twelve) hours.  Dispense: 30 tablet; Refill: 5  --------------------------------------------------------------------    Margaretann Loveless, PA-C  [facility]

## 2017-11-13 DIAGNOSIS — Z0000 Encounter for general adult medical examination without abnormal findings: Secondary | ICD-10-CM | POA: Diagnosis not present

## 2017-11-13 DIAGNOSIS — Z8249 Family history of ischemic heart disease and other diseases of the circulatory system: Secondary | ICD-10-CM | POA: Diagnosis not present

## 2017-11-13 DIAGNOSIS — Z833 Family history of diabetes mellitus: Secondary | ICD-10-CM | POA: Diagnosis not present

## 2017-11-13 DIAGNOSIS — E7800 Pure hypercholesterolemia, unspecified: Secondary | ICD-10-CM | POA: Diagnosis not present

## 2017-11-13 DIAGNOSIS — E039 Hypothyroidism, unspecified: Secondary | ICD-10-CM | POA: Diagnosis not present

## 2017-11-14 ENCOUNTER — Telehealth: Payer: Self-pay

## 2017-11-14 LAB — COMPREHENSIVE METABOLIC PANEL
ALT: 13 IU/L (ref 0–32)
AST: 16 IU/L (ref 0–40)
Albumin/Globulin Ratio: 1.6 (ref 1.2–2.2)
Albumin: 4.1 g/dL (ref 3.5–5.5)
Alkaline Phosphatase: 128 IU/L — ABNORMAL HIGH (ref 39–117)
BILIRUBIN TOTAL: 0.5 mg/dL (ref 0.0–1.2)
BUN/Creatinine Ratio: 11 (ref 9–23)
BUN: 7 mg/dL (ref 6–24)
CHLORIDE: 103 mmol/L (ref 96–106)
CO2: 24 mmol/L (ref 20–29)
Calcium: 9 mg/dL (ref 8.7–10.2)
Creatinine, Ser: 0.62 mg/dL (ref 0.57–1.00)
GFR calc non Af Amer: 100 mL/min/{1.73_m2} (ref >59)
GFR, EST AFRICAN AMERICAN: 116 mL/min/{1.73_m2} (ref >59)
GLUCOSE: 88 mg/dL (ref 65–99)
Globulin, Total: 2.5 g/dL (ref 1.5–4.5)
Potassium: 4.3 mmol/L (ref 3.5–5.2)
Sodium: 144 mmol/L (ref 134–144)
TOTAL PROTEIN: 6.6 g/dL (ref 6.0–8.5)

## 2017-11-14 LAB — CBC WITH DIFFERENTIAL/PLATELET
BASOS ABS: 0 10*3/uL (ref 0.0–0.2)
Basos: 1 %
EOS (ABSOLUTE): 0.1 10*3/uL (ref 0.0–0.4)
Eos: 2 %
Hematocrit: 39.8 % (ref 34.0–46.6)
Hemoglobin: 13.2 g/dL (ref 11.1–15.9)
IMMATURE GRANS (ABS): 0 10*3/uL (ref 0.0–0.1)
IMMATURE GRANULOCYTES: 0 %
LYMPHS: 35 %
Lymphocytes Absolute: 1.7 10*3/uL (ref 0.7–3.1)
MCH: 30.3 pg (ref 26.6–33.0)
MCHC: 33.2 g/dL (ref 31.5–35.7)
MCV: 91 fL (ref 79–97)
Monocytes Absolute: 0.3 10*3/uL (ref 0.1–0.9)
Monocytes: 6 %
NEUTROS ABS: 2.8 10*3/uL (ref 1.4–7.0)
NEUTROS PCT: 56 %
PLATELETS: 237 10*3/uL (ref 150–379)
RBC: 4.36 x10E6/uL (ref 3.77–5.28)
RDW: 13.7 % (ref 12.3–15.4)
WBC: 4.9 10*3/uL (ref 3.4–10.8)

## 2017-11-14 LAB — LIPID PANEL
CHOLESTEROL TOTAL: 213 mg/dL — AB (ref 100–199)
Chol/HDL Ratio: 3.4 ratio (ref 0.0–4.4)
HDL: 63 mg/dL (ref >39)
LDL CALC: 131 mg/dL — AB (ref 0–99)
Triglycerides: 95 mg/dL (ref 0–149)
VLDL Cholesterol Cal: 19 mg/dL (ref 5–40)

## 2017-11-14 LAB — HEMOGLOBIN A1C
ESTIMATED AVERAGE GLUCOSE: 111 mg/dL
HEMOGLOBIN A1C: 5.5 % (ref 4.8–5.6)

## 2017-11-14 LAB — TSH: TSH: 2.65 u[IU]/mL (ref 0.450–4.500)

## 2017-11-14 NOTE — Telephone Encounter (Signed)
-----   Message from Margaretann LovelessJennifer M Burnette, PA-C sent at 11/14/2017  8:32 AM EST ----- Cholesterol levels up slightly from last year. Continue working on healthy lifestyle modifications and increasing physical activity to get up to 150 min moderate activity per week. All other labs are WNL and stable.

## 2017-11-14 NOTE — Telephone Encounter (Signed)
Patient advised as directed below.  Thanks,  -Joseline 

## 2017-11-15 ENCOUNTER — Encounter (HOSPITAL_COMMUNITY): Payer: Self-pay

## 2017-11-16 ENCOUNTER — Telehealth: Payer: Self-pay

## 2017-11-16 LAB — PAP IG W/ RFLX HPV ASCU: PAP Smear Comment: 0

## 2017-11-16 NOTE — Telephone Encounter (Signed)
Patient advised as directed below.  Thanks,  -Joseline 

## 2017-11-16 NOTE — Telephone Encounter (Signed)
-----   Message from Margaretann LovelessJennifer M Burnette, [place]PA-C sent at 11/16/2017  9:57 AM EST ----- Pap is normal.  Will repeat in 1 year still to get 3 normals then will go back to normal screening. This is first normal, will need 2 more annual paps to total the 3 normals.

## 2017-12-28 DIAGNOSIS — Z1231 Encounter for screening mammogram for malignant neoplasm of breast: Secondary | ICD-10-CM | POA: Diagnosis not present

## 2017-12-28 LAB — HM MAMMOGRAPHY

## 2017-12-31 ENCOUNTER — Telehealth: Payer: Self-pay

## 2017-12-31 ENCOUNTER — Telehealth: Payer: Self-pay | Admitting: Physician Assistant

## 2017-12-31 ENCOUNTER — Encounter: Payer: Self-pay | Admitting: Physician Assistant

## 2017-12-31 DIAGNOSIS — Z1211 Encounter for screening for malignant neoplasm of colon: Secondary | ICD-10-CM

## 2017-12-31 NOTE — Telephone Encounter (Signed)
Mammogram Results Bi-RADS category:1 Negative.

## 2017-12-31 NOTE — Telephone Encounter (Signed)
Patient advised as directed below. Per patient she has not receive the Cologuard kit screening she spoke to you about. I don't see that it was order. I went ahead and ordered it for you.

## 2017-12-31 NOTE — Telephone Encounter (Signed)
Order for cologuard faxed to [facility] °

## 2018-06-13 ENCOUNTER — Ambulatory Visit
Admission: RE | Admit: 2018-06-13 | Discharge: 2018-06-13 | Disposition: A | Discharge status: Home / Self Care | Payer: BLUE CROSS/BLUE SHIELD | Source: Ambulatory Visit | Attending: Physician Assistant | Admitting: Physician Assistant

## 2018-06-13 ENCOUNTER — Ambulatory Visit (INDEPENDENT_AMBULATORY_CARE_PROVIDER_SITE_OTHER): Payer: BLUE CROSS/BLUE SHIELD | Admitting: Physician Assistant

## 2018-06-13 ENCOUNTER — Encounter: Payer: Self-pay | Admitting: Physician Assistant

## 2018-06-13 VITALS — BP 130/80 | HR 80 | Temp 98.5°F | Resp 16 | Wt 201.1 lb

## 2018-06-13 DIAGNOSIS — Z23 Encounter for immunization: Secondary | ICD-10-CM | POA: Diagnosis not present

## 2018-06-13 DIAGNOSIS — Q740 Other congenital malformations of upper limb(s), including shoulder girdle: Secondary | ICD-10-CM

## 2018-06-13 DIAGNOSIS — E039 Hypothyroidism, unspecified: Secondary | ICD-10-CM

## 2018-06-13 DIAGNOSIS — L659 Nonscarring hair loss, unspecified: Secondary | ICD-10-CM

## 2018-06-13 DIAGNOSIS — R222 Localized swelling, mass and lump, trunk: Secondary | ICD-10-CM | POA: Diagnosis not present

## 2018-06-13 DIAGNOSIS — E041 Nontoxic single thyroid nodule: Secondary | ICD-10-CM

## 2018-06-13 DIAGNOSIS — R5383 Other fatigue: Secondary | ICD-10-CM

## 2018-06-13 DIAGNOSIS — R2231 Localized swelling, mass and lump, right upper limb: Secondary | ICD-10-CM | POA: Diagnosis not present

## 2018-06-13 NOTE — Progress Notes (Signed)
Patient: Melissa Cherry Female    DOB: 04-23-1961   57 y.o.   MRN: [id number] Visit Date: 06/13/2018  Today's Provider: Margaretann LovelessJennifer M Yaniris Braddock, PA-C   Chief Complaint  Patient presents with  . Follow-up    Thyroid   Subjective:    HPI  Hypothyroid, follow-up:  TSH  Date Value Ref Range Status  11/13/2017 2.650 0.450 - 4.500 uIU/mL Final  11/09/2016 2.620 0.450 - 4.500 uIU/mL Final  10/09/2016 5.410 (H) 0.450 - 4.500 uIU/mL Final   Wt Readings from Last 3 Encounters:  06/13/18 201 lb 1.6 oz (91.2 kg)  11/12/17 196 lb 12.8 oz (89.3 kg)  11/09/16 194 lb (88 kg)    She was last seen for hypothyroid 7 months ago.  Management since that visit includes none. She reports excellent compliance with treatment. She is not having side effects.  She is experiencing change in energy level and heat / cold intolerance and itching all over. She reports that she notice a lump 2-3 weeks ago and that is not going away. Her sister recently had a thyroid goiter removed.   She denies diarrhea, nervousness, palpitations and weight changes  Weight trend: stable      Allergies  Allergen Reactions  . Tamiflu [Oseltamivir Phosphate] Rash     Current Outpatient Medications:  .  levothyroxine (SYNTHROID, LEVOTHROID) 25 MCG tablet, Take 1 tablet (25 mcg total) by mouth daily before breakfast., Disp: 90 tablet, Rfl: 3 .  loratadine (CLARITIN) 10 MG tablet, Take 10 mg by mouth daily as needed for allergies. , Disp: , Rfl:  .  pseudoephedrine-guaifenesin (MUCINEX D) 60-600 MG 12 hr tablet, Take 1 tablet by mouth every 12 (twelve) hours., Disp: 30 tablet, Rfl: 5  Review of Systems  Constitutional: Positive for fatigue and unexpected weight change.  Respiratory: Negative.   Cardiovascular: Negative.   Skin:       Hair loss and itching scalp  Neurological: Negative.   Psychiatric/Behavioral: The patient is nervous/anxious.     Social History   Tobacco Use  . Smoking status: Former Smoker     Last attempt to quit: 10/02/1996    Years since quitting: 21.7  . Smokeless tobacco: Never Used  Substance Use Topics  . Alcohol use: Yes    Comment: Occasionally; 2-3 beers/week at most (during summer)   Objective:   BP 130/80 (BP Location: Left Arm, Patient Position: Sitting, Cuff Size: Normal)   Pulse 80   Temp 98.5 F (36.9 C) (Oral)   Resp 16   Wt 201 lb 1.6 oz (91.2 kg)   SpO2 98%   BMI 36.78 kg/m  Vitals:   06/13/18 1131  BP: 130/80  Pulse: 80  Resp: 16  Temp: 98.5 F (36.9 C)  TempSrc: Oral  SpO2: 98%  Weight: 201 lb 1.6 oz (91.2 kg)     Physical Exam  Constitutional: She appears well-developed and well-nourished. No distress.  Neck: Normal range of motion. Neck supple. No JVD present. No tracheal deviation present. Thyroid mass present. No thyromegaly present.    Cardiovascular: Normal rate, regular rhythm and normal heart sounds. Exam reveals no gallop and no friction rub.  No murmur heard. Pulmonary/Chest: Effort normal and breath sounds normal. No respiratory distress. She has no wheezes. She has no rales.    Lymphadenopathy:    She has no cervical adenopathy.  Skin: She is not diaphoretic.  Vitals reviewed.       Assessment & Plan:  1. Thyroid nodule Possibly small thyroid nodule on right thyroid wing. Not 100% but felt small abnormality on exam. Since patient is having symptoms of fatigue, weight gain and hair loss will get imaging for further eval. Will f/u with labs and thyroid US to confirm or R/O nodule. I willf/u pending results.  - CBC w/Diff/Platelet - TSH - US THYROID; Future  2. Fatigue, unspecified type See above medical treatment plan. - CBC w/Diff/Platelet - US THYROID; Future  3. Hair loss See above medical treatment plan. - CBC w/Diff/Platelet - US THYROID; Future  4. Abnormal prominence of clavicle Bony mass noted on right clavicle. May be just abnormal appearance of right clavicle but will get xray as below to r/o  abnormality that may require work up. I will f/u pending xray results.  - DG Clavicle Right; Future  5. Need for influenza vaccination Flu vaccine given today without complication. Patient sat upright for 15 minutes to check for adverse reaction before being released. - Flu Vaccine QUAD 36+ mos IM       Margaretann Loveless, PA-C  [facility]

## 2018-06-14 ENCOUNTER — Telehealth: Payer: Self-pay

## 2018-06-14 LAB — CBC WITH DIFFERENTIAL/PLATELET
BASOS ABS: 0.1 10*3/uL (ref 0.0–0.2)
Basos: 1 %
EOS (ABSOLUTE): 0.1 10*3/uL (ref 0.0–0.4)
Eos: 2 %
HEMOGLOBIN: 12.4 g/dL (ref 11.1–15.9)
Hematocrit: 38.5 % (ref 34.0–46.6)
Immature Grans (Abs): 0 10*3/uL (ref 0.0–0.1)
Immature Granulocytes: 0 %
LYMPHS ABS: 1.7 10*3/uL (ref 0.7–3.1)
LYMPHS: 32 %
MCH: 27.7 pg (ref 26.6–33.0)
MCHC: 32.2 g/dL (ref 31.5–35.7)
MCV: 86 fL (ref 79–97)
MONOCYTES: 10 %
Monocytes Absolute: 0.5 10*3/uL (ref 0.1–0.9)
NEUTROS PCT: 55 %
Neutrophils Absolute: 3 10*3/uL (ref 1.4–7.0)
Platelets: 272 10*3/uL (ref 150–450)
RBC: 4.47 x10E6/uL (ref 3.77–5.28)
RDW: 12.7 % (ref 12.3–15.4)
WBC: 5.4 10*3/uL (ref 3.4–10.8)

## 2018-06-14 LAB — TSH: TSH: 3.01 u[IU]/mL (ref 0.450–4.500)

## 2018-06-14 MED ORDER — LEVOTHYROXINE SODIUM 50 MCG PO TABS: 50.0000 ug | ORAL_TABLET | Freq: Every day | ORAL | 1 refills | Status: DC

## 2018-06-14 NOTE — Telephone Encounter (Signed)
Pt returned call ° °teri °

## 2018-06-14 NOTE — Telephone Encounter (Signed)
-----   Message from Margaretann LovelessJennifer M Burnette, [place] sent at 06/13/2018  5:23 PM EDT ----- Herby AbrahamXray is unremarkable. Just seems to be a more prominent head of the right clavicle.

## 2018-06-14 NOTE — Addendum Note (Signed)
Addended by: Margaretann LovelessBURNETTE, Kerring[place] Sova M on: 06/14/2018 09:52 AM   Modules accepted: Orders

## 2018-06-14 NOTE — Telephone Encounter (Signed)
Patient advised as directed as below.  Thanks,  -Joseline

## 2018-06-14 NOTE — Telephone Encounter (Signed)
No answer. Call cannot be completed at this time.  Thanks,  -Joseline

## 2018-06-14 NOTE — Telephone Encounter (Signed)
LMTCB

## 2018-06-14 NOTE — Telephone Encounter (Signed)
-----   Message from Margaretann LovelessJennifer M Burnette, PA-C sent at 06/14/2018  9:51 AM EDT ----- Thyroid is normal but increasing some. Due to your symptoms I would recommend increasing your levothyroxine just slightly and rechecking in 4-6 weeks to see if symptoms improve. All other labs are normal.

## 2018-06-19 ENCOUNTER — Ambulatory Visit
Admission: RE | Admit: 2018-06-19 | Discharge: 2018-06-19 | Disposition: A | Discharge status: Home / Self Care | Payer: BLUE CROSS/BLUE SHIELD | Source: Ambulatory Visit | Attending: Physician Assistant | Admitting: Physician Assistant

## 2018-06-19 DIAGNOSIS — E034 Atrophy of thyroid (acquired): Secondary | ICD-10-CM | POA: Diagnosis not present

## 2018-06-19 DIAGNOSIS — L659 Nonscarring hair loss, unspecified: Secondary | ICD-10-CM | POA: Diagnosis not present

## 2018-06-19 DIAGNOSIS — E041 Nontoxic single thyroid nodule: Secondary | ICD-10-CM

## 2018-06-19 DIAGNOSIS — R5383 Other fatigue: Secondary | ICD-10-CM | POA: Diagnosis not present

## 2018-06-20 ENCOUNTER — Telehealth: Payer: Self-pay

## 2018-06-20 NOTE — Telephone Encounter (Signed)
Patient advised as below.  

## 2018-06-20 NOTE — Telephone Encounter (Signed)
lmtcb

## 2018-06-20 NOTE — Telephone Encounter (Signed)
-----   Message from Margaretann LovelessJennifer M Burnette, [place] sent at 06/19/2018  5:09 PM EDT ----- Normal thyroid [place]

## 2018-06-28 ENCOUNTER — Other Ambulatory Visit: Payer: Self-pay | Admitting: Physician Assistant

## 2018-06-28 DIAGNOSIS — E041 Nontoxic single thyroid nodule: Secondary | ICD-10-CM

## 2018-06-28 DIAGNOSIS — E039 Hypothyroidism, unspecified: Secondary | ICD-10-CM

## 2018-06-28 MED ORDER — LEVOTHYROXINE SODIUM 50 MCG PO TABS: 50.0000 ug | ORAL_TABLET | Freq: Every day | ORAL | 1 refills | Status: DC

## 2018-06-28 NOTE — Telephone Encounter (Signed)
Pt needing her levothyroxine (SYNTHROID, LEVOTHROID) 50 MCG tablet Called into her mail order pharmacy. She didn't pick up the Rx at local pharmacy. Pt started taking 2 of her 25mg  since her Rx mg was increased.    Please call into: [facility] - [phone]  Thanks, [facility]

## 2018-06-28 NOTE — Telephone Encounter (Signed)
Please review. Thanks!  

## 2018-12-30 ENCOUNTER — Other Ambulatory Visit: Payer: Self-pay | Admitting: Physician Assistant

## 2018-12-30 DIAGNOSIS — E041 Nontoxic single thyroid nodule: Secondary | ICD-10-CM

## 2018-12-30 DIAGNOSIS — E039 Hypothyroidism, unspecified: Secondary | ICD-10-CM

## 2018-12-30 NOTE — Telephone Encounter (Signed)
Please Review. Last labs were 06/2018.

## 2019-03-07 ENCOUNTER — Ambulatory Visit (INDEPENDENT_AMBULATORY_CARE_PROVIDER_SITE_OTHER): Payer: BC Managed Care – PPO | Admitting: Physician Assistant

## 2019-03-07 ENCOUNTER — Other Ambulatory Visit: Payer: Self-pay

## 2019-03-07 ENCOUNTER — Encounter: Payer: Self-pay | Admitting: Physician Assistant

## 2019-03-07 VITALS — BP 135/80 | HR 64 | Temp 98.4°F | Resp 16 | Ht 61.5 in | Wt 211.6 lb

## 2019-03-07 DIAGNOSIS — M7661 Achilles tendinitis, right leg: Secondary | ICD-10-CM | POA: Diagnosis not present

## 2019-03-07 DIAGNOSIS — M79644 Pain in right finger(s): Secondary | ICD-10-CM

## 2019-03-07 NOTE — Patient Instructions (Signed)
Tendinitis  Tendinitis is swelling (inflammation) of a tendon. A tendon is a cord of tissue that connects muscle to bone. Tendinitis can cause pain, tenderness, and swelling. What are the causes?  Using a tendon or muscle too much (overuse). This is a common cause.  Wear and tear that happens as you age.  Injury.  Some medical conditions, such as arthritis.  Some medicines. What increases the risk? You are more likely to get this condition if you do activities that involve the same movements over and over again (repetitive motions). What are the signs or symptoms?  Pain.  Tenderness.  Mild swelling.  Decreased range of motion. How is this treated? This condition is usually treated with RICE therapy. RICE stands for:  Rest.  Ice.  Compression. This means putting pressure on the affected area.  Elevation. This means raising the affected area above the level of your heart. Treatment may also include:  Medicines for swelling or pain.  Exercises or physical therapy.  A brace or splint.  Surgery. This is rarely needed. Follow these instructions at home: If you have a splint or brace:  Wear the splint or brace as told by your doctor. Remove it only as told by your doctor.  Loosen the splint or brace if your fingers or toes: ? Tingle. ? Become numb. ? Turn cold and blue.  Keep the splint or brace clean.  If the splint or brace is not waterproof: ? Do not let it get wet. ? Cover it with a watertight covering when you take a bath or shower. Managing pain, stiffness, and swelling      If told, put ice on the affected area. ? If you have a removable splint or brace, remove it as told by your doctor. ? Put ice in a plastic bag. ? Place a towel between your skin and the bag. ? Leave the ice on for 20 minutes, 2-3 times a day.  Move the fingers or toes of the affected arm or leg often, if this applies. This helps to prevent stiffness and to lessen swelling.   If told, raise the affected area above the level of your heart while you are sitting or lying down.  If told, put heat on the affected area before you exercise. Use the heat source that your doctor recommends, such as a moist heat pack or a heating pad. ? Place a towel between your skin and the heat source. ? Leave the heat on for 20-30 minutes. ? Remove the heat if your skin turns bright red. This is very important if you are unable to feel pain, heat, or cold. You may have a greater risk of getting burned. Driving  Do not drive or use heavy machinery while taking prescription pain medicine.  Ask your doctor when it is safe to drive if you have a splint or brace on any part of your arm or leg. Activity  Rest the affected area as told by your doctor.  Return to your normal activities as told by your doctor. Ask your doctor what activities are safe for you.  Avoid using the affected area while you have symptoms.  Do exercises as told by your doctor. General instructions  If you have a splint, do not put pressure on any part of the splint until it is fully hardened. This may take several hours.  Wear an elastic bandage or pressure (compression) wrap only as told by your doctor.  Take over-the-counter and prescription medicines only as   told by your doctor.  Keep all follow-up visits as told by your doctor. This is important. Contact a doctor if:  You do not get better.  You get new problems, such as numbness in your hands, and you do not know why. Summary  Tendinitis is swelling (inflammation) of a tendon.  You are more likely to get this condition if you do activities that involve the same movements over and over again.  This condition is usually treated with RICE therapy. RICE stands for rest, ice, compression, and elevate.  Avoid using the affected area while you have symptoms. This information is not intended to replace advice given to you by your health care provider.  Make sure you discuss any questions you have with your health care provider. Document Released: 12/29/2010 Document Revised: 02/06/2018 Document Reviewed: 02/06/2018 Elsevier Interactive Patient Education  2019 Elsevier Inc.  

## 2019-03-07 NOTE — Progress Notes (Signed)
Patient: Melissa Cherry Female    DOB: 06-24-1961   58 y.o.   MRN: [id number] Visit Date: 03/07/2019  Today's Provider: Trey Sailors, PA-C   Chief Complaint  Patient presents with  . Hand Pain  . Foot Pain   Subjective:     HPI Patient here today c/o right finger injury x's 2 months. Patient reports pain and swelling. Patient also c/o right heel pain on and off.   Patient reports that two months ago she was walking her dog and the leash tugged against her finger. Her right finger became painful and swollen. It has improved somewhat over time. It can feel weak when she is lifting bags. It hurts less now and she feels she has good ROM in it currently. No numbness or tingling.   She also reports sharp left heel pain at the back of her heel that worsens with activity and improves with rest. She does not recall any injury. She will take some ibuprofen with some relief but she has had a history of bariatric surgery.   Allergies  Allergen Reactions  . Tamiflu [Oseltamivir Phosphate] Rash     Current Outpatient Medications:  .  loratadine (CLARITIN) 10 MG tablet, Take 10 mg by mouth daily as needed for allergies. , Disp: , Rfl:  .  SYNTHROID 50 MCG tablet, TAKE 1 TABLET DAILY BEFORE BREAKFAST, Disp: 90 tablet, Rfl: 1  Review of Systems  Constitutional: Negative.   Respiratory: Negative.   Musculoskeletal: Positive for myalgias.    Social History   Tobacco Use  . Smoking status: Former Smoker    Last attempt to quit: 10/02/1996    Years since quitting: 22.4  . Smokeless tobacco: Never Used  Substance Use Topics  . Alcohol use: Yes    Comment: Occasionally; 2-3 beers/week at most (during summer)      Objective:   BP 135/80 (BP Location: Left Arm, Patient Position: Sitting, Cuff Size: Large)   Pulse 64   Temp 98.4 F (36.9 C) (Oral)   Resp 16   Ht 5' 1.5" (1.562 m)   Wt 211 lb 9.6 oz (96 kg)   SpO2 98%   BMI 39.33 kg/m  Vitals:   03/07/19 1137  BP: 135/80   Pulse: 64  Resp: 16  Temp: 98.4 F (36.9 C)  TempSrc: Oral  SpO2: 98%  Weight: 211 lb 9.6 oz (96 kg)  Height: 5' 1.5" (1.562 m)     Physical Exam Constitutional:      Appearance: Normal appearance.  Musculoskeletal:     Right hand: She exhibits swelling. She exhibits normal range of motion and no tenderness.       Feet:     Comments: There is some swelling around the PIP joint of the right ring finger. ROM is preserved.   Skin:    General: Skin is warm and dry.  Neurological:     Mental Status: She is alert.         Assessment & Plan    1. Finger pain, right  Likely hyperextension injury, perhaps volar plate tear/avulsion injury. Joint does not appear gross unstable and she has good ROM. Time of injury was two months ago. Can get xray and refer to hand surgery, patient would like to observe right now.   2. Tendonitis, Achilles, right  Counseled on treatments including rest, ICE, and PT. She is not candidate for NSAIDs due to history of bariatric surgery. If conservative measures do not  work, she may have to see podiatry. She will call back if she chooses to do xray.   The entirety of the information documented in the History of Present Illness, Review of Systems and Physical Exam were personally obtained by me. Portions of this information were initially documented by Rondel BatonSulibeya Dimas, CMA and reviewed by me for thoroughness and accuracy.   F/u PRN.       Trey SailorsAdriana M Pollak, PA-C  [facility]

## 2019-03-10 DIAGNOSIS — Z9289 Personal history of other medical treatment: Secondary | ICD-10-CM | POA: Diagnosis not present

## 2019-03-10 DIAGNOSIS — Z1231 Encounter for screening mammogram for malignant neoplasm of breast: Secondary | ICD-10-CM | POA: Diagnosis not present

## 2019-03-10 LAB — HM MAMMOGRAPHY

## 2019-08-12 ENCOUNTER — Other Ambulatory Visit: Payer: Self-pay

## 2019-08-12 ENCOUNTER — Ambulatory Visit (INDEPENDENT_AMBULATORY_CARE_PROVIDER_SITE_OTHER): Payer: BC Managed Care – PPO

## 2019-08-12 DIAGNOSIS — Z23 Encounter for immunization: Secondary | ICD-10-CM | POA: Diagnosis not present

## 2019-08-25 ENCOUNTER — Other Ambulatory Visit: Payer: Self-pay | Admitting: Physician Assistant

## 2019-08-25 DIAGNOSIS — E041 Nontoxic single thyroid nodule: Secondary | ICD-10-CM

## 2019-08-25 DIAGNOSIS — E039 Hypothyroidism, unspecified: Secondary | ICD-10-CM

## 2019-08-25 NOTE — Telephone Encounter (Signed)
Requested medication (s) are due for refill today: yes  Requested medication (s) are on the active medication list: yes  Last refill:  04/29/2019  Future visit scheduled: no  Notes to clinic:  Review for refill Overdue for office visit    Requested Prescriptions  Pending Prescriptions Disp Refills   SYNTHROID 50 MCG tablet [Pharmacy Med Name: SYNTHROID TAB 0.05MG ] 90 tablet 1    Sig: TAKE 1 TABLET DAILY BEFORE BREAKFAST     Endocrinology:  Hypothyroid Agents Failed - 08/25/2019  9:27 AM      Failed - TSH needs to be rechecked within 3 months after an abnormal result. Refill until TSH is due.      Failed - TSH in normal range and within 360 days    TSH  Date Value Ref Range Status  06/13/2018 3.010 0.450 - 4.500 uIU/mL Final         Passed - Valid encounter within last 12 months    Recent Outpatient Visits          5 months ago Finger pain, right   [facility] [place], [place], [place]   1 year ago Thyroid nodule   [place], [place], [place]   1 year ago Annual physical exam   [place], Cle[place], [place]   2 years ago Annual physical exam   [place], [place]   2 years ago Hypothyroidism, unspecified type   [facility], [place], [place]

## 2019-08-29 ENCOUNTER — Ambulatory Visit
Admission: EM | Admit: 2019-08-29 | Discharge: 2019-08-29 | Disposition: A | Discharge status: Home / Self Care | Payer: BC Managed Care – PPO | Attending: Family Medicine | Admitting: Family Medicine

## 2019-08-29 ENCOUNTER — Other Ambulatory Visit: Payer: Self-pay

## 2019-08-29 ENCOUNTER — Encounter: Payer: Self-pay | Admitting: Emergency Medicine

## 2019-08-29 DIAGNOSIS — Z87891 Personal history of nicotine dependence: Secondary | ICD-10-CM

## 2019-08-29 DIAGNOSIS — Z20822 Contact with and (suspected) exposure to covid-19: Secondary | ICD-10-CM

## 2019-08-29 DIAGNOSIS — Z20828 Contact with and (suspected) exposure to other viral communicable diseases: Secondary | ICD-10-CM | POA: Diagnosis not present

## 2019-08-29 NOTE — ED Provider Notes (Signed)
[facility] ____________________________________________  Time seen: Approximately 6:05 PM  I have reviewed the triage vital signs and the nursing notes.   HISTORY  Chief Complaint covid testing   HPI Melissa Cherry is a 58 y.o. female presenting for COVID-19 testing.  Patient reports she was directly exposed from her sister this past week.  Reports is received positive test results today.  States that she did overall feel tired yesterday.  States feeling well today.  Denies cough, chest pain or shortness of breath, fever, nasal congestion, sore throat, change in taste or smell, vomiting or diarrhea.  States overall feels well.  Denies any relieving factors.  Denies other complaints.   Past Medical History:  Diagnosis Date  . Complication of anesthesia    too high of episural  . GERD (gastroesophageal reflux disease)   . Hypercholesteremia   . Hypothyroidism   . Morbid obesity ([place])   . OSA (obstructive sleep apnea)     Patient Active Problem List   Diagnosis Date Noted  . Hip flexor tendinitis, left 11/09/2016  . Plantar fascial fibromatosis of left foot 11/09/2016  . Allergic rhinitis 09/01/2015  . Breathing-related sleep disorder 09/01/2015  . Dermoid inclusion cyst 09/01/2015  . Avitaminosis D 09/01/2015  . Hypothyroidism 03/08/2015  . Obesity (BMI 30-39.9) 12/17/2013  . History of Lap Roux-en-Y gastric bypass 04/14/13 05/02/2013  . OSA on CPAP 04/15/2013  . Hypercholesteremia 04/15/2013  . Fatty liver 04/15/2013  . Adiposity 01/08/2010  . Malaise and fatigue 01/06/2010  . Adaptation reaction 11/24/2009  . H/O contraceptive use 12/11/2008  . Family history of cardiovascular disease 12/02/2008  . History of tobacco use 12/02/2008  . Carbuncle and furuncle 11/25/2008  . CD (contact dermatitis) 11/25/2008  . History of methicillin resistant Staphylococcus aureus infection 11/25/2008  . Abnormal C-reactive protein 12/12/2007    Past Surgical History:   Procedure Laterality Date  . CESAREAN SECTION  2000, 2003  . DILATION AND CURETTAGE OF UTERUS  1999  . GANGLION CYST EXCISION Right 1992  . GASTRIC ROUX-EN-Y N/A 04/14/2013   Procedure: LAPAROSCOPIC ROUX-EN-Y GASTRIC BYPASS WITH UPPER ENDOSCOPY;  Surgeon: Gayland Curry, MD;  Location: [facility];  Service: General;  Laterality: N/A;  . TONSILLECTOMY  as child     No current facility-administered medications for this encounter.   Current Outpatient Medications:  .  SYNTHROID 50 MCG tablet, TAKE 1 TABLET DAILY BEFORE BREAKFAST, Disp: 90 tablet, Rfl: 1 .  loratadine (CLARITIN) 10 MG tablet, Take 10 mg by mouth daily as needed for allergies. , Disp: , Rfl:   Allergies Tamiflu [oseltamivir phosphate]  Family History  Problem Relation Age of Onset  . Diabetes Father   . Heart disease Father   . Breast cancer Mother   . Dementia Mother   . Mental retardation Sister   . Hip fracture Sister   . Healthy Brother   . Healthy Sister     Social History Social History   Tobacco Use  . Smoking status: Former Smoker    Quit date: 10/02/1996    Years since quitting: 22.9  . Smokeless tobacco: Never Used  Substance Use Topics  . Alcohol use: Yes    Comment: Occasionally; 2-3 beers/week at most (during summer)  . Drug use: No    Review of Systems Constitutional: No fever ENT: No sore throat. Cardiovascular: Denies chest pain. Respiratory: Denies shortness of breath. Gastrointestinal: No abdominal pain.  No nausea, no vomiting.  No diarrhea.  Genitourinary: Negative for dysuria. Musculoskeletal:  Negative for back pain. Skin: Negative for rash.   ____________________________________________   PHYSICAL EXAM:  VITAL SIGNS: ED Triage Vitals [08/29/19 1716]  Enc Vitals Group     BP 124/79     Pulse Rate 66     Resp 18     Temp 98.5 F (36.9 C)     Temp Source Oral     SpO2 99 %     Weight 200 lb (90.7 kg)     Height 5' 1.5" (1.562 m)     Head Circumference      Peak Flow       Pain Score 0     Pain Loc      Pain Edu?      Excl. in GC?     Constitutional: Alert and oriented. Well appearing and in no acute distress. Eyes: Conjunctivae are normal.  ENT      Head: Normocephalic and atraumatic. Cardiovascular: Normal rate, regular rhythm. Grossly normal heart sounds.  Good peripheral circulation. Respiratory: Normal respiratory effort without tachypnea nor retractions. Breath sounds are clear and equal bilaterally. No wheezes, rales, rhonchi. Musculoskeletal: Steady gait. Neurologic:  Normal speech and language. Speech is normal. No gait instability.  Skin:  Skin is warm, dry and intact. No rash noted. Psychiatric: Mood and affect are normal. Speech and behavior are normal. Patient exhibits appropriate insight and judgment   ___________________________________________   LABS (all labs ordered are listed, but only abnormal results are displayed)  Labs Reviewed  NOVEL CORONAVIRUS, NAA (HOSP ORDER, SEND-OUT TO REF LAB; TAT 18-24 HRS)    PROCEDURES Procedures   INITIAL IMPRESSION / ASSESSMENT AND PLAN / ED COURSE  Pertinent labs & imaging results that were available during my care of the patient were reviewed by me and considered in my medical decision making (see chart for details).  Well-appearing patient.  No acute distress.  Direct COVID-19 exposure, denies complaints at this time.  COVID-19 testing completed and advice given.  Monitor and supportive care.  Discussed follow up with Primary care physician this week as needed. Discussed follow up and return parameters including no resolution or any worsening concerns. Patient verbalized understanding and agreed to plan.   ____________________________________________   FINAL CLINICAL IMPRESSION(S) / ED DIAGNOSES  Final diagnoses:  Exposure to COVID-19 virus     ED Discharge Orders    None       Note: This dictation was prepared with Dragon dictation along with smaller phrase technology. Any  transcriptional errors that result from this process are unintentional.         Renford Dills, NP 08/29/19 1934

## 2019-08-29 NOTE — ED Triage Notes (Signed)
Pt present to [place] for covid testing. She was exposed by her sister. No symptoms.

## 2019-08-30 LAB — NOVEL CORONAVIRUS, NAA (HOSP ORDER, SEND-OUT TO REF LAB; TAT 18-24 HRS): SARS-CoV-2, NAA: NOT DETECTED

## 2020-02-13 ENCOUNTER — Other Ambulatory Visit: Payer: Self-pay | Admitting: Physician Assistant

## 2020-02-13 ENCOUNTER — Telehealth: Payer: Self-pay | Admitting: Physician Assistant

## 2020-02-13 DIAGNOSIS — E039 Hypothyroidism, unspecified: Secondary | ICD-10-CM

## 2020-02-13 DIAGNOSIS — E041 Nontoxic single thyroid nodule: Secondary | ICD-10-CM

## 2020-02-13 NOTE — Telephone Encounter (Signed)
Called and scheduled patient for CPE last 11/12/17. Needed appointment for medication refill.

## 2020-02-13 NOTE — Telephone Encounter (Signed)
Requested medication (s) are due for refill today: yes  Requested medication (s) are on the active medication list: yes  Last refill: 11/01/2019  Future visit scheduled: Yes CPE 03/02/20  Notes to clinic: last TSH checked 06/13/18. Please review. Patient has been scheduled for 03/02/20 CPE    Requested Prescriptions  Pending Prescriptions Disp Refills   SYNTHROID 50 MCG tablet [Pharmacy Med Name: SYNTHROID TAB 0.05MG ] 90 tablet 1    Sig: TAKE 1 TABLET DAILY BEFORE BREAKFAST      Endocrinology:  Hypothyroid Agents Failed - 02/13/2020  4:13 PM      Failed - TSH needs to be rechecked within 3 months after an abnormal result. Refill until TSH is due.      Failed - TSH in normal range and within 360 days    TSH  Date Value Ref Range Status  06/13/2018 3.010 0.450 - 4.500 uIU/mL Final          Passed - Valid encounter within last 12 months    Recent Outpatient Visits           11 months ago Finger pain, right   [facility] [place], [place], [place]   1 year ago Thyroid nodule   [facility] [place], [place], [place]   2 years ago Annual physical exam   [facility] [place], [place], [place]   3 years ago Annual physical exam   [facility] [place], [place]   3 years ago Hypothyroidism, unspecified type   [facility], [place], [place]

## 2020-02-27 NOTE — Progress Notes (Signed)
Complete physical exam   Patient: Melissa Cherry   DOB: March 04, 1961   59 y.o. Female  MRN: [id number] Visit Date: 03/02/2020  Today's healthcare provider: Margaretann Loveless, PA-C   Chief Complaint  Patient presents with  . Annual Exam   Subjective    Melissa Cherry is a 59 y.o. female who presents today for a complete physical exam.  She reports consuming a general diet. The patient does not participate in regular exercise at present. She generally feels well. She reports sleeping fairly well. She does have additional problems to discuss today. She is having additional problems with her right heel (back side). HPI    Past Medical History:  Diagnosis Date  . Complication of anesthesia    too high of episural  . GERD (gastroesophageal reflux disease)   . Hypercholesteremia   . Hypothyroidism   . Morbid obesity (HCC)   . OSA (obstructive sleep apnea)    Past Surgical History:  Procedure Laterality Date  . CESAREAN SECTION  2000, 2003  . DILATION AND CURETTAGE OF UTERUS  1999  . GANGLION CYST EXCISION Right 1992  . GASTRIC ROUX-EN-Y N/A 04/14/2013   Procedure: LAPAROSCOPIC ROUX-EN-Y GASTRIC BYPASS WITH UPPER ENDOSCOPY;  Surgeon: Atilano Ina, MD;  Location: [facility];  Service: General;  Laterality: N/A;  . TONSILLECTOMY  as child   Social History   Socioeconomic History  . Marital status: Married    Spouse name: Not on file  . Number of children: 2  . Years of education: Not on file  . Highest education level: Not on file  Occupational History  . Not on file  Tobacco Use  . Smoking status: Former Smoker    Quit date: 10/02/1996    Years since quitting: 23.4  . Smokeless tobacco: Never Used  Substance and Sexual Activity  . Alcohol use: Yes    Comment: Occasionally; 2-3 beers/week at most (during summer)  . Drug use: No  . Sexual activity: Not on file  Other Topics Concern  . Not on file  Social History Narrative  . Not on file   Social Determinants of Health     Financial Resource Strain:   . Difficulty of Paying Living Expenses:   Food Insecurity:   . Worried About Programme researcher, broadcasting/film/video in the Last Year:   . Barista in the Last Year:   Transportation Needs:   . Freight forwarder (Medical):   Marland Kitchen Lack of Transportation (Non-Medical):   Physical Activity:   . Days of Exercise per Week:   . Minutes of Exercise per Session:   Stress:   . Feeling of Stress :   Social Connections:   . Frequency of Communication with Friends and Family:   . Frequency of Social Gatherings with Friends and Family:   . Attends Religious Services:   . Active Member of Clubs or Organizations:   . Attends Banker Meetings:   Marland Kitchen Marital Status:   Intimate Partner Violence:   . Fear of Current or Ex-Partner:   . Emotionally Abused:   Marland Kitchen Physically Abused:   . Sexually Abused:    Family Status  Relation Name Status  . Father  Deceased  . Mother  Deceased  . Sister  Alive  . Brother  Alive  . Sister  Alive  . Daughter  Alive  . Son  Alive   Family History  Problem Relation Age of Onset  . Diabetes Father   .  Heart disease Father   . Breast cancer Mother   . Dementia Mother   . Mental retardation Sister   . Hip fracture Sister   . Healthy Brother   . Healthy Sister    Allergies  Allergen Reactions  . Tamiflu [Oseltamivir Phosphate] Rash    Patient Care Team: Reine Just as PCP - General (Family Medicine) Himmelrich, Loree Fee, RD (Inactive) as Dietitian Jae Dire)   Medications: Outpatient Medications Prior to Visit  Medication Sig  . loratadine (CLARITIN) 10 MG tablet Take 10 mg by mouth daily as needed for allergies.   . Pseudoephedrine-guaiFENesin (MUCINEX D PO) Take by mouth 3 times/day as needed-between meals & bedtime.  Marland Kitchen SYNTHROID 50 MCG tablet TAKE 1 TABLET DAILY BEFORE BREAKFAST   No facility-administered medications prior to visit.    Review of Systems  Constitutional: Positive for chills and  fatigue.  HENT: Positive for dental problem.   Eyes: Negative.   Respiratory: Negative.   Cardiovascular: Negative.   Gastrointestinal: Negative.   Endocrine: Negative.   Genitourinary: Negative.   Musculoskeletal: Positive for gait problem.  Skin: Negative.   Allergic/Immunologic: Negative.   Hematological: Negative.   Psychiatric/Behavioral: Negative.        Objective    BP 133/85 (BP Location: Right Arm, Patient Position: Sitting, Cuff Size: Large)   Pulse 66   Temp (!) 97 F (36.1 C) (Temporal)   Resp 16   Ht 5\' 1"  (1.549 m)   Wt 216 lb (98 kg)   BMI 40.81 kg/m  BP Readings from Last 3 Encounters:  03/02/20 133/85  08/29/19 124/79  03/07/19 135/80   Wt Readings from Last 3 Encounters:  03/02/20 216 lb (98 kg)  08/29/19 200 lb (90.7 kg)  03/07/19 211 lb 9.6 oz (96 kg)      Physical Exam Vitals reviewed.  Constitutional:      General: She is not in acute distress.    Appearance: Normal appearance. She is well-developed. She is obese. She is not ill-appearing or diaphoretic.  HENT:     Head: Normocephalic and atraumatic.     Right Ear: Hearing, tympanic membrane, ear canal and external ear normal.     Left Ear: Hearing, tympanic membrane, ear canal and external ear normal.     Nose: Nose normal.     Mouth/Throat:     Mouth: Mucous membranes are moist.     Pharynx: Oropharynx is clear. Uvula midline. No oropharyngeal exudate or posterior oropharyngeal erythema.  Eyes:     General: No scleral icterus.       Right eye: No discharge.        Left eye: No discharge.     Extraocular Movements: Extraocular movements intact.     Conjunctiva/sclera: Conjunctivae normal.     Pupils: Pupils are equal, round, and reactive to light.  Neck:     Thyroid: No thyromegaly.     Vascular: No carotid bruit or JVD.     Trachea: No tracheal deviation.  Cardiovascular:     Rate and Rhythm: Normal rate and regular rhythm.     Pulses: Normal pulses.     Heart sounds: Normal  heart sounds. No murmur. No friction rub. No gallop.   Pulmonary:     Effort: Pulmonary effort is normal. No respiratory distress.     Breath sounds: Normal breath sounds. No wheezing or rales.  Chest:     Chest wall: No tenderness.     Breasts: Breasts are symmetrical.  Right: No inverted nipple, mass, nipple discharge, skin change or tenderness.        Left: No inverted nipple, mass, nipple discharge, skin change or tenderness.  Abdominal:     General: Abdomen is flat. Bowel sounds are normal. There is no distension.     Palpations: Abdomen is soft. There is no mass.     Tenderness: There is no abdominal tenderness. There is no guarding or rebound.     Hernia: There is no hernia in the left inguinal area or right inguinal area.  Genitourinary:    General: Normal vulva.     Exam position: Supine.     Labia:        Right: No rash, tenderness, lesion or injury.        Left: No rash, tenderness, lesion or injury.      Vagina: Normal. No signs of injury. No vaginal discharge, erythema, tenderness or bleeding.     Cervix: Normal.     Uterus: Normal.      Adnexa: Right adnexa normal and left adnexa normal.       Right: No mass, tenderness or fullness.         Left: No mass, tenderness or fullness.       Rectum: Normal.  Musculoskeletal:        General: No tenderness. Normal range of motion.     Cervical back: Normal range of motion and neck supple.     Right lower leg: No edema.     Left lower leg: No edema.       Legs:  Lymphadenopathy:     Cervical: No cervical adenopathy.     Lower Body: No right inguinal adenopathy. No left inguinal adenopathy.  Skin:    General: Skin is warm and dry.     Capillary Refill: Capillary refill takes less than 2 seconds.     Findings: No rash.  Neurological:     General: No focal deficit present.     Mental Status: She is alert and oriented to person, place, and time. Mental status is at baseline.     Cranial Nerves: No cranial nerve  deficit.     Coordination: Coordination normal.     Deep Tendon Reflexes: Reflexes are normal and symmetric.  Psychiatric:        Mood and Affect: Mood normal.        Behavior: Behavior normal.        Thought Content: Thought content normal.        Judgment: Judgment normal.      Depression Screen  PHQ 2/9 Scores 03/02/2020 11/12/2017 11/09/2016  PHQ - 2 Score 0 3 0  PHQ- 9 Score - 6 -    No results found for any visits on 03/02/20.  Assessment & Plan    Routine Health Maintenance and Physical Exam  Exercise Activities and Dietary recommendations Goals   None     Immunization History  Administered Date(s) Administered  . H1N1 08/20/2008  . Influenza Split 07/09/2009, 07/21/2010, 07/26/2011, 07/30/2012  . Influenza,inj,Quad PF,6+ Mos 09/27/2017, 06/13/2018, 08/12/2019  . Moderna SARS-COVID-2 Vaccination 10/27/2019, 11/24/2019  . Td 11/09/2016  . Tdap 01/18/2006    Health Maintenance  Topic Date Due  . COLONOSCOPY  Never done  . MAMMOGRAM  03/09/2020  . INFLUENZA VACCINE  05/02/2020  . PAP SMEAR-Modifier  11/12/2020  . TETANUS/TDAP  11/09/2026  . COVID-19 Vaccine  Completed  . Hepatitis C Screening  Completed  . HIV Screening  Completed  Discussed health benefits of physical activity, and encouraged her to engage in regular exercise appropriate for her age and condition.  1. Encounter for annual general medical examination without abnormal findings in adult Normal physical exam today. Will check labs as below and f/u pending lab results. If labs are stable and WNL she will not need to have these rechecked for one year at her next annual physical exam. She is to call the office in the meantime if she has any acute issue, questions or concerns.  2. Encounter for breast cancer screening using non-mammogram modality Breast exam today was normal. There is family history of breast cancer in her mother. She does perform regular self breast exams. Mammogram was ordered as  below. Information for [facility] was given to patient so she may schedule her mammogram at her convenience. - MM 3D SCREEN BREAST BILATERAL  3. Colon cancer screening Did not do cologuard ordered last year. Will reorder.  - Cologuard  4. Atypical squamous cells of undetermined significance on cytologic smear of cervix (ASC-US) H/O abnormal. Has had one normal in 2019. Last year was skipped due to covid 19 pandemic. If normal, this will be 2nd normal. Will need 1 more normal for 3 consecutive normal paps. If so, can then return to normal screenings.  - Cytology - PAP  5. Thyroid nodule Stable on Synthroid . Will check labs as below and f/u pending results. - CBC with Differential/Platelet - Comprehensive metabolic panel - TSH  6. Hypothyroidism, unspecified type See above medical treatment plan. - CBC with Differential/Platelet - Comprehensive metabolic panel - TSH  7. Class 3 severe obesity due to excess calories with serious comorbidity and body mass index (BMI) of 40.0 to 44.9 in adult [facility]) Counseled patient on healthy lifestyle modifications including dieting and exercise.  - CBC with Differential/Platelet - Comprehensive metabolic panel - Hemoglobin A1c - Ambulatory referral to diabetic education  8. OSA on CPAP Stable. Using CPAP nightly and has improvement in sleep with use. Continue nightly use.  - CBC with Differential/Platelet - Comprehensive metabolic panel  9. Fatty liver Diet controlled. Will check labs as below and f/u pending results. - CBC with Differential/Platelet - Comprehensive metabolic panel - Hemoglobin A1c  10. Hypercholesteremia Diet controlled. Will check labs as below and f/u pending results. - CBC with Differential/Platelet - Comprehensive metabolic panel - Hemoglobin A1c  11. Avitaminosis D H/O this and postmenopausal. Will check labs as below and f/u pending results. - Vitamin D (25 hydroxy)  12. Right Achilles  bursitis Noted on right. Discussed stretching, ice and heat. Wear supportive shoes. Referral placed to podiatry for further evaluation. Consult appreciated.  - Ambulatory referral to Podiatry  13. S/P bariatric surgery H/O this and has been stable. Has gained about 20 pounds since Covid 19 pandemic. Referral to nutritionist as below.  - Ambulatory referral to diabetic education   No follow-ups on file.     Delmer Islam, PA-C, have reviewed all documentation for this visit. The documentation on 03/03/20 for the exam, diagnosis, procedures, and orders are all accurate and complete.   Reine Just  [facility] [phone] (phone) [phone] (fax)  [facility]

## 2020-03-02 ENCOUNTER — Ambulatory Visit (INDEPENDENT_AMBULATORY_CARE_PROVIDER_SITE_OTHER): Payer: BC Managed Care – PPO | Admitting: Physician Assistant

## 2020-03-02 ENCOUNTER — Encounter: Payer: Self-pay | Admitting: Physician Assistant

## 2020-03-02 ENCOUNTER — Other Ambulatory Visit (HOSPITAL_COMMUNITY)
Admission: RE | Admit: 2020-03-02 | Discharge: 2020-03-02 | Disposition: A | Discharge status: Home / Self Care | Payer: BC Managed Care – PPO | Source: Ambulatory Visit | Attending: Physician Assistant | Admitting: Physician Assistant

## 2020-03-02 ENCOUNTER — Other Ambulatory Visit: Payer: Self-pay

## 2020-03-02 VITALS — BP 133/85 | HR 66 | Temp 97.0°F | Resp 16 | Ht 61.0 in | Wt 216.0 lb

## 2020-03-02 DIAGNOSIS — E559 Vitamin D deficiency, unspecified: Secondary | ICD-10-CM

## 2020-03-02 DIAGNOSIS — R87610 Atypical squamous cells of undetermined significance on cytologic smear of cervix (ASC-US): Secondary | ICD-10-CM

## 2020-03-02 DIAGNOSIS — E039 Hypothyroidism, unspecified: Secondary | ICD-10-CM | POA: Diagnosis not present

## 2020-03-02 DIAGNOSIS — Z1211 Encounter for screening for malignant neoplasm of colon: Secondary | ICD-10-CM

## 2020-03-02 DIAGNOSIS — G4733 Obstructive sleep apnea (adult) (pediatric): Secondary | ICD-10-CM

## 2020-03-02 DIAGNOSIS — E041 Nontoxic single thyroid nodule: Secondary | ICD-10-CM

## 2020-03-02 DIAGNOSIS — Z9884 Bariatric surgery status: Secondary | ICD-10-CM | POA: Diagnosis not present

## 2020-03-02 DIAGNOSIS — Z0000 Encounter for general adult medical examination without abnormal findings: Secondary | ICD-10-CM | POA: Diagnosis not present

## 2020-03-02 DIAGNOSIS — Z9989 Dependence on other enabling machines and devices: Secondary | ICD-10-CM

## 2020-03-02 DIAGNOSIS — M7661 Achilles tendinitis, right leg: Secondary | ICD-10-CM | POA: Diagnosis not present

## 2020-03-02 DIAGNOSIS — Z6841 Body Mass Index (BMI) 40.0 and over, adult: Secondary | ICD-10-CM

## 2020-03-02 DIAGNOSIS — E7800 Pure hypercholesterolemia, unspecified: Secondary | ICD-10-CM

## 2020-03-02 DIAGNOSIS — Z1239 Encounter for other screening for malignant neoplasm of breast: Secondary | ICD-10-CM

## 2020-03-02 DIAGNOSIS — K760 Fatty (change of) liver, not elsewhere classified: Secondary | ICD-10-CM

## 2020-03-02 NOTE — Patient Instructions (Signed)

## 2020-03-03 ENCOUNTER — Encounter: Payer: Self-pay | Admitting: Physician Assistant

## 2020-03-03 ENCOUNTER — Telehealth: Payer: Self-pay

## 2020-03-03 LAB — COMPREHENSIVE METABOLIC PANEL
ALT: 12 IU/L (ref 0–32)
AST: 24 IU/L (ref 0–40)
Albumin/Globulin Ratio: 1.8 (ref 1.2–2.2)
Albumin: 4.3 g/dL (ref 3.8–4.9)
Alkaline Phosphatase: 139 IU/L — ABNORMAL HIGH (ref 48–121)
BUN/Creatinine Ratio: 10 (ref 9–23)
BUN: 7 mg/dL (ref 6–24)
Bilirubin Total: 0.3 mg/dL (ref 0.0–1.2)
CO2: 23 mmol/L (ref 20–29)
Calcium: 9.1 mg/dL (ref 8.7–10.2)
Chloride: 101 mmol/L (ref 96–106)
Creatinine, Ser: 0.73 mg/dL (ref 0.57–1.00)
GFR calc Af Amer: 104 mL/min/{1.73_m2} (ref >59)
GFR calc non Af Amer: 90 mL/min/{1.73_m2} (ref >59)
Globulin, Total: 2.4 g/dL (ref 1.5–4.5)
Glucose: 82 mg/dL (ref 65–99)
Potassium: 4.3 mmol/L (ref 3.5–5.2)
Sodium: 140 mmol/L (ref 134–144)
Total Protein: 6.7 g/dL (ref 6.0–8.5)

## 2020-03-03 LAB — CBC WITH DIFFERENTIAL/PLATELET
Basophils Absolute: 0.1 10*3/uL (ref 0.0–0.2)
Basos: 1 %
EOS (ABSOLUTE): 0.2 10*3/uL (ref 0.0–0.4)
Eos: 3 %
Hematocrit: 36.1 % (ref 34.0–46.6)
Hemoglobin: 11.8 g/dL (ref 11.1–15.9)
Immature Grans (Abs): 0 10*3/uL (ref 0.0–0.1)
Immature Granulocytes: 0 %
Lymphocytes Absolute: 2.2 10*3/uL (ref 0.7–3.1)
Lymphs: 34 %
MCH: 27.9 pg (ref 26.6–33.0)
MCHC: 32.7 g/dL (ref 31.5–35.7)
MCV: 85 fL (ref 79–97)
Monocytes Absolute: 0.5 10*3/uL (ref 0.1–0.9)
Monocytes: 8 %
Neutrophils Absolute: 3.4 10*3/uL (ref 1.4–7.0)
Neutrophils: 54 %
Platelets: 266 10*3/uL (ref 150–450)
RBC: 4.23 x10E6/uL (ref 3.77–5.28)
RDW: 13.1 % (ref 11.7–15.4)
WBC: 6.4 10*3/uL (ref 3.4–10.8)

## 2020-03-03 LAB — HEMOGLOBIN A1C
Est. average glucose Bld gHb Est-mCnc: 114 mg/dL
Hgb A1c MFr Bld: 5.6 % (ref 4.8–5.6)

## 2020-03-03 LAB — TSH: TSH: 2.75 u[IU]/mL (ref 0.450–4.500)

## 2020-03-03 LAB — VITAMIN D 25 HYDROXY (VIT D DEFICIENCY, FRACTURES): Vit D, 25-Hydroxy: 22.5 ng/mL — ABNORMAL LOW (ref 30.0–100.0)

## 2020-03-03 NOTE — Telephone Encounter (Signed)
Patient advised as directed below. Per patient she is ok to monitor the labs.

## 2020-03-03 NOTE — Telephone Encounter (Signed)
-----   Message from Margaretann Loveless, PA-C sent at 03/03/2020  8:21 AM EDT ----- Blood count is normal. Kidney function is normal. Liver enzymes are ok. Alkaline phosphatase is borderline high. It has been trending upwards over the last 3 years. This is most likely secondary to fatty liver. We could get an [place] to evaluate if desired. Since not having any abdominal pain symptoms, I am alos ok to monitor the labs. Let me know which you prefer. A1c/sugar is normal. Thyroid is normal. Vit D is borderline low. Recommend OTC Vit D of 2000 IU daily.

## 2020-03-04 ENCOUNTER — Telehealth: Payer: Self-pay

## 2020-03-04 LAB — CYTOLOGY - PAP
Comment: NEGATIVE
Diagnosis: NEGATIVE
High risk HPV: NEGATIVE

## 2020-03-04 NOTE — Telephone Encounter (Signed)
-----   Message from Margaretann Loveless, [place] sent at 03/04/2020  2:32 PM EDT ----- Pap is normal, HPV negative.  This is the 2nd consecutive normal, last was in 2019. Will repeat next year. If still normal, that will be the 3rd consecutive normal and can return to regular 5 year screening.

## 2020-03-04 NOTE — Telephone Encounter (Signed)
LMTCB or to view providers message/results on my chart. If patient calls back [facility] for [facility] nurse to give results.

## 2020-03-05 NOTE — Telephone Encounter (Signed)
Attempted to call pt.  Left vm. to return call to office to discuss recent lab results.  

## 2020-03-08 NOTE — Telephone Encounter (Signed)
Patient advised as directed below. 

## 2020-03-11 ENCOUNTER — Other Ambulatory Visit: Payer: Self-pay | Admitting: Physician Assistant

## 2020-03-11 DIAGNOSIS — E039 Hypothyroidism, unspecified: Secondary | ICD-10-CM

## 2020-03-11 DIAGNOSIS — Z1231 Encounter for screening mammogram for malignant neoplasm of breast: Secondary | ICD-10-CM | POA: Diagnosis not present

## 2020-03-11 DIAGNOSIS — E041 Nontoxic single thyroid nodule: Secondary | ICD-10-CM

## 2020-03-11 LAB — HM MAMMOGRAPHY

## 2020-03-12 ENCOUNTER — Telehealth: Payer: Self-pay | Admitting: Dietician

## 2020-03-15 ENCOUNTER — Encounter: Payer: Self-pay | Admitting: Physician Assistant

## 2020-03-24 ENCOUNTER — Ambulatory Visit (INDEPENDENT_AMBULATORY_CARE_PROVIDER_SITE_OTHER): Payer: BC Managed Care – PPO | Admitting: Podiatry

## 2020-03-24 ENCOUNTER — Ambulatory Visit (INDEPENDENT_AMBULATORY_CARE_PROVIDER_SITE_OTHER): Payer: BC Managed Care – PPO

## 2020-03-24 ENCOUNTER — Encounter: Payer: Self-pay | Admitting: Podiatry

## 2020-03-24 ENCOUNTER — Other Ambulatory Visit: Payer: Self-pay

## 2020-03-24 DIAGNOSIS — M722 Plantar fascial fibromatosis: Secondary | ICD-10-CM

## 2020-03-24 DIAGNOSIS — M778 Other enthesopathies, not elsewhere classified: Secondary | ICD-10-CM | POA: Diagnosis not present

## 2020-03-24 DIAGNOSIS — M7661 Achilles tendinitis, right leg: Secondary | ICD-10-CM | POA: Diagnosis not present

## 2020-03-24 MED ORDER — METHYLPREDNISOLONE 4 MG PO TBPK: ORAL_TABLET | ORAL | 0 refills | Status: DC

## 2020-03-24 MED ORDER — MELOXICAM 15 MG PO TABS: 15.0000 mg | ORAL_TABLET | Freq: Every day | ORAL | 3 refills | Status: DC

## 2020-03-24 NOTE — Progress Notes (Signed)
Subjective:  Patient ID: Melissa Cherry, female    DOB: Mar 25, 1961,  MRN: [id number] HPI Chief Complaint  Patient presents with   Foot Pain    Patient presents today for right heel/achilles pain x 2-3 months. She also says she has these nodules on bottoms of feet at arches.  Her  right foot burns and is painful when first getting up in the mornings.  She states "the knots doesn't hurt, they just feel weird."  She also states her left 2nd and 3rd toes are seperating but denies any pain    59 y.o. female presents with the above complaint.   ROS: Denies fever chills nausea vomiting muscle aches pains calf pain back pain chest pain shortness of breath.  Past Medical History:  Diagnosis Date   Complication of anesthesia    too high of episural   GERD (gastroesophageal reflux disease)    Hypercholesteremia    Hypothyroidism    Morbid obesity (HCC)    OSA (obstructive sleep apnea)    Past Surgical History:  Procedure Laterality Date   CESAREAN SECTION  2000, 2003   DILATION AND CURETTAGE OF UTERUS  1999   GANGLION CYST EXCISION Right 1992   GASTRIC ROUX-EN-Y N/A 04/14/2013   Procedure: LAPAROSCOPIC ROUX-EN-Y GASTRIC BYPASS WITH UPPER ENDOSCOPY;  Surgeon: Atilano Ina, MD;  Location: [facility];  Service: General;  Laterality: N/A;   TONSILLECTOMY  as child    Current Outpatient Medications:    loratadine (CLARITIN) 10 MG tablet, Take 10 mg by mouth daily as needed for allergies. , Disp: , Rfl:    meloxicam (MOBIC) 15 MG tablet, Take 1 tablet (15 mg total) by mouth daily., Disp: 30 tablet, Rfl: 3   methylPREDNISolone (MEDROL DOSEPAK) 4 MG TBPK tablet, 6 day dose pack - take as directed, Disp: 21 tablet, Rfl: 0   Pseudoephedrine-guaiFENesin (MUCINEX D PO), Take by mouth 3 times/day as needed-between meals & bedtime., Disp: , Rfl:    SYNTHROID 50 MCG tablet, TAKE 1 TABLET DAILY BEFORE BREAKFAST, Disp: 90 tablet, Rfl: 3  Allergies  Allergen Reactions   Tamiflu [Oseltamivir  Phosphate] Rash   Review of Systems Objective:  There were no vitals filed for this visit.  General: Well developed, nourished, in no acute distress, alert and oriented x3   Dermatological: Skin is warm, dry and supple bilateral. Nails x 10 are well maintained; remaining integument appears unremarkable at this time. There are no open sores, no preulcerative lesions, no rash or signs of infection present.  Vascular: Dorsalis Pedis artery and Posterior Tibial artery pedal pulses are 2/4 bilateral with immedate capillary fill time. Pedal hair growth present. No varicosities and no lower extremity edema present bilateral.   Neruologic: Grossly intact via light touch bilateral. Vibratory intact via tuning fork bilateral. Protective threshold with Semmes Wienstein monofilament intact to all pedal sites bilateral. Patellar and Achilles deep tendon reflexes 2+ bilateral. No Babinski or clonus noted bilateral.   Musculoskeletal: No gross boney pedal deformities bilateral. No pain, crepitus, or limitation noted with foot and ankle range of motion bilateral. Muscular strength 5/5 in all groups tested bilateral.  Gait: Unassisted, Nonantalgic.    Radiographs:  Radiographs taken today demonstrate soft tissue swelling of the Achilles posterior aspect right foot.  Otherwise no acute findings radiographically.  Assessment & Plan:   Assessment: Insertional Achilles tendinitis right foot.  Bursitis Achilles area right foot.  Left foot and right foot demonstrate plantar fibromatosis measuring the largest at 3-1/2 cm.  She also  has medial deviation of the second toe left capsulitis.  Plan: Painful Achilles bursitis was injected today dexamethasone and local anesthetic placed in a night splint started on a Medrol Dosepak to be followed by meloxicam.  I would like to follow-up with her in 1 month if not improved may need to consider another injection or physical therapy or MRI.     Romin Divita T. [place], [place]

## 2020-04-22 DIAGNOSIS — Z1211 Encounter for screening for malignant neoplasm of colon: Secondary | ICD-10-CM | POA: Diagnosis not present

## 2020-04-28 LAB — EXTERNAL GENERIC LAB PROCEDURE: COLOGUARD: NEGATIVE

## 2020-04-28 LAB — COLOGUARD: Cologuard: NEGATIVE

## 2020-05-03 ENCOUNTER — Ambulatory Visit: Payer: BC Managed Care – PPO | Admitting: Podiatry

## 2020-05-26 ENCOUNTER — Other Ambulatory Visit: Payer: Self-pay

## 2020-05-26 ENCOUNTER — Encounter: Payer: Self-pay | Admitting: Dietician

## 2020-05-26 ENCOUNTER — Encounter: Payer: BC Managed Care – PPO | Attending: Physician Assistant | Admitting: Dietician

## 2020-05-26 VITALS — Ht 61.0 in | Wt 215.3 lb

## 2020-05-26 DIAGNOSIS — Z6841 Body Mass Index (BMI) 40.0 and over, adult: Secondary | ICD-10-CM | POA: Diagnosis not present

## 2020-05-26 NOTE — Patient Instructions (Signed)
   Allow at least 3 hours between meals and snacks.   Take time out to pre-prep some meals, and have healthy options on hand such as frozen/ vacuum packed chicken or fish, frozen veggies.   Resume some exercise gradually -- start with a short duration such as 10-15 minutes and gradually increase time and/or frequency.

## 2020-05-26 NOTE — Progress Notes (Signed)
Medical Nutrition Therapy: Visit start time: 1100  end time: 1200  Assessment:  Diagnosis: obesity Past medical history: bariatric surgery 2014, sleep apnea, hyperlipidemia Psychosocial issues/ stress concerns: high stress level  Preferred learning method:   Auditory  Hands-on   Current weight: 215.3lbs Height: 5'1" Medications, supplements: reconciled list in medical record  Progress and evaluation:   Patient reports she lost over 100lbs after bariatric surgery in 2014, has regained about half of the lost weight. Lowest weight was 158lbs, gained most weight in the past 1-2 years  Reports very busy schedule with full time work and trying to sell her deceased parents' home; she is generally not feeling as well anymore as she did at her lower weight.  She has been feeling hungry frequently, as little as 2 hours after eating.   often not sleeping well, cares for disabled sister who does not sleep well at night (alternating nights with another sister). Relationship with her brother is stressful.  Physical activity: none currently  Dietary Intake:  Usual eating pattern includes 3 meals and 2-3 snacks per day. Dining out frequency: 2-4 meals per week.  Breakfast: coffee + granola bar or yogurt, sometimes dry cereal Snack: almonds, decaf unsweet tea or coffee with sugar free creamer Lunch: occ kids meal ie chicken strips, few fries; pasta salad, watermelon Snack: some candy ie bit-o-honey, candy bar Supper: spaghetti, chicken and potatoes, usu meat and veg;  Snack: almonds, cheese or pb crackers Beverages: water, unsweet tea, coffee  Nutrition Care Education: Topics covered:  Basic nutrition: basic food groups, appropriate nutrient balance after bariatric surgery, appropriate meal and snack schedule  Weight control: importance of low sugar and low fat choices, portion control strategies including using smaller plates/ containers, provided guidance for adequate protein, generous  portions of low-carb vegetables, and small, controlled portions of starchy foods/ carbs -- advised limiting any added fats; discussed tracking food intake; provided sample bariatric menus for 1200kcal; discussed effects of stress on weight; discussed resuming some exercise Other lifestyle changes:  Effects of stress on ability to implement changes, making changes incrementally or in manageable ways considering other responsibilities and busy schedule.  Nutritional Diagnosis:  Alba-3.3 Overweight/obesity As related to stress, excess calories, inadequate physical activity.  As evidenced by patient with current BMI of 40.7.  Intervention:   Instruction and discussion as noted above.  Established goals for change with direction from patient.   Patient voices understanding of goals and motivation to implement goals.  Education Materials given:   Designer, industrial/product with food lists  Bariatric portion plate  Sample bariatric menus-- 1200kcal  Goals/ instructions   Learner/ who was taught:   Patient   Level of understanding:  Verbalizes/ demonstrates competency   Demonstrated degree of understanding via:   Teach back Learning barriers:  None  Willingness to learn/ readiness for change:  Acceptance, ready for change   Monitoring and Evaluation:  Dietary intake, exercise, and body weight      follow up: 06/30/20 at 10:00am

## 2020-06-02 ENCOUNTER — Encounter: Payer: Self-pay | Admitting: Physician Assistant

## 2020-06-02 NOTE — Telephone Encounter (Signed)
This encounter was created in error - please disregard.

## 2020-06-30 ENCOUNTER — Ambulatory Visit: Payer: BC Managed Care – PPO | Admitting: Dietician

## 2020-07-14 ENCOUNTER — Encounter: Payer: Self-pay | Admitting: Dietician

## 2020-07-14 NOTE — Progress Notes (Signed)
Have not heard back from patient to reschedule her cancelled appointment from 06/30/20. Sent notification to referring provider.

## 2020-08-23 ENCOUNTER — Telehealth (INDEPENDENT_AMBULATORY_CARE_PROVIDER_SITE_OTHER): Payer: BC Managed Care – PPO | Admitting: Family Medicine

## 2020-08-23 ENCOUNTER — Other Ambulatory Visit: Payer: Self-pay

## 2020-08-23 ENCOUNTER — Encounter: Payer: Self-pay | Admitting: Family Medicine

## 2020-08-23 VITALS — Temp 99.0°F

## 2020-08-23 DIAGNOSIS — R0981 Nasal congestion: Secondary | ICD-10-CM | POA: Diagnosis not present

## 2020-08-23 DIAGNOSIS — J029 Acute pharyngitis, unspecified: Secondary | ICD-10-CM

## 2020-08-23 MED ORDER — AMOXICILLIN 875 MG PO TABS: 875.0000 mg | ORAL_TABLET | Freq: Two times a day (BID) | ORAL | 0 refills | Status: DC

## 2020-08-23 NOTE — Progress Notes (Signed)
MyChart Video Visit    Virtual Visit via Video Note   This visit type was conducted due to national recommendations for restrictions regarding the COVID-19 Pandemic (e.g. social distancing) in an effort to limit this patient's exposure and mitigate transmission in our community. This patient is at least at moderate risk for complications without adequate follow up. This format is felt to be most appropriate for this patient at this time. Physical exam was limited by quality of the video and audio technology used for the visit.   Patient location: home Provider location: office  I discussed the limitations of evaluation and management by telemedicine and the availability of in person appointments. The patient expressed understanding and agreed to proceed.  Patient: Melissa Cherry   DOB: 1961/01/23   59 y.o. Female  MRN: [id number] Visit Date: 08/23/2020  Today's healthcare provider: Dortha Kern, PA   No chief complaint on file.  Subjective    HPI   The patient is a 59 year old female who presents via video visit for symptoms of possible Covid.  She states that about 1 week ago She began having sore throat and headache.  Over the course of the week it has gotten progressively worse.  Symptoms now include; fatigue, non productive cough, sinus pain and pressure, runny nose, low grade fever in the evenings and glands in her neck swollen and sore.  She denies any eye pain, dizziness, chest pain, or shortness of breath. She did note that she had dental surgery with a bone graft 2 weeks prior.  She has not had any known exposure to Covid in the last 2 weeks but did have prior to that. Has had a negative COVID test on 08-17-20 and 08-20-20. She is vaccinated with 2 shots, not the booster yet.    Medications: Outpatient Medications Prior to Visit  Medication Sig  . Calcium Carbonate-Vitamin D (CALCIUM 500 + D PO) Take 1 Piece by mouth. Chewable once daily  . loratadine (CLARITIN) 10 MG  tablet Take 10 mg by mouth daily as needed for allergies.   . Multiple Vitamins-Minerals (MULTIVITAMIN ADULTS 50+ PO) Take 1 tablet by mouth daily.  . Pseudoephedrine-guaiFENesin (MUCINEX D PO) Take by mouth 3 times/day as needed-between meals & bedtime.  Marland Kitchen SYNTHROID 50 MCG tablet TAKE 1 TABLET DAILY BEFORE BREAKFAST  . [DISCONTINUED] meloxicam (MOBIC) 15 MG tablet Take 1 tablet (15 mg total) by mouth daily.  . [DISCONTINUED] methylPREDNISolone (MEDROL DOSEPAK) 4 MG TBPK tablet 6 day dose pack - take as directed   No facility-administered medications prior to visit.    Review of Systems  Constitutional: Negative.   HENT: Positive for congestion, postnasal drip and sore throat.   Respiratory:       Minimal cough with PND.  Cardiovascular: Negative.   Gastrointestinal: Negative.       Objective    Temp 99 F (37.2 C)    Physical Exam: WDWN fe,a;e in no apparent distress.  Head: Normocephalic, atraumatic. Neck: Supple, NROM Respiratory: No apparent distress Psych: Normal mood and affect Throat: Minimal erythema without exudates.    Assessment & Plan     1. Sore throat Onset the past couple days with head congestion and PND. No loss of sense of taste or smell. Temp 99 without chills or body aches. No GI upset. Concerned about bringing 82 year old daughter home after knee surgery (fell on ice). May use saltwater gargles and Tylenol or Advil prn. Treat with antibiotic and recheck prn.-  amoxicillin (AMOXIL) 875 MG tablet; Take 1 tablet (875 mg total) by mouth 2 (two) times daily.  Dispense: 20 tablet; Refill: 0  2. Sinus congestion Started a week ago and has had 2 negative COVID tests. May use Mucinex-D and Delsym if cough develops. Given antibiotic and recheck prn. - amoxicillin (AMOXIL) 875 MG tablet; Take 1 tablet (875 mg total) by mouth 2 (two) times daily.  Dispense: 20 tablet; Refill: 0   No follow-ups on file.     I discussed the assessment and treatment plan with the  patient. The patient was provided an opportunity to ask questions and all were answered. The patient agreed with the plan and demonstrated an understanding of the instructions.   The patient was advised to call back or seek an in-person evaluation if the symptoms worsen or if the condition fails to improve as anticipated.  I provided 20 minutes of non-face-to-face time during this encounter.  Haywood Pao, PA, have reviewed all documentation for this visit. The documentation on 08/23/20 for the exam, diagnosis, procedures, and orders are all accurate and complete.   Dortha Kern, PA [facility] [phone] (phone) [phone] (fax)  [facility]

## 2020-08-24 ENCOUNTER — Telehealth: Payer: Self-pay | Admitting: Family Medicine

## 2020-09-07 ENCOUNTER — Telehealth: Payer: Self-pay

## 2020-09-07 DIAGNOSIS — J029 Acute pharyngitis, unspecified: Secondary | ICD-10-CM

## 2020-09-07 DIAGNOSIS — R0981 Nasal congestion: Secondary | ICD-10-CM

## 2020-09-07 MED ORDER — AMOXICILLIN 875 MG PO TABS: 875.0000 mg | ORAL_TABLET | Freq: Two times a day (BID) | ORAL | 0 refills | Status: DC

## 2020-09-07 NOTE — Telephone Encounter (Signed)
Copied from CRM [id number]. Topic: General - Inquiry >> Sep 07, 2020  2:10 PM Daphine Deutscher D wrote: Reason for CRM: Pt called saying she had a virtual visit with Maurine Minister recently for possible sinus infection.  He put her on amoxil and she finished it about 4 or 5 days ago.   She states now one side of her throat is sore and gland feel swollen on that side.  Can a nurse call back  [phone]

## 2020-09-07 NOTE — Telephone Encounter (Signed)
Finished the Amoxil about 4-5 days ago. No fever but having some PND and more sore throat return the past day or two. Wants to try another round of the Amoxil since it helped a great deal last time. Recommend using a combination of equal parts of liquid Benadryl with Maalox Liquid to gargle then swallow 3-4 times a day with the Amoxil. Recheck if no better in a week.

## 2021-03-04 ENCOUNTER — Encounter: Payer: Self-pay | Admitting: Physician Assistant

## 2021-04-19 ENCOUNTER — Ambulatory Visit: Payer: Self-pay | Admitting: Family Medicine

## 2021-04-19 NOTE — Progress Notes (Deleted)
Complete physical exam   Patient: Melissa Cherry   DOB: 02-27-1961   60 y.o. Female  MRN: [id number] Visit Date: 04/19/2021  Today's healthcare provider: Shirlee Latch, MD   No chief complaint on file.  Subjective    Melissa Cherry is a 60 y.o. female who presents today for a complete physical exam.  She reports consuming a {diet types:17450} diet. {Exercise:19826} She generally feels {well/fairly well/poorly:18703}. She reports sleeping {well/fairly well/poorly:18703}. She {does/does not:200015} have additional problems to discuss today.  HPI  03/02/20 Pap/HPV-negative 03/11/20 Mammogram-BI-RADS 1 04/22/20 Cologuard-negative  Past Medical History:  Diagnosis Date   Complication of anesthesia    too high of episural   GERD (gastroesophageal reflux disease)    Hypercholesteremia    Hypothyroidism    Morbid obesity (HCC)    OSA (obstructive sleep apnea)    Past Surgical History:  Procedure Laterality Date   CESAREAN SECTION  2000, 2003   DILATION AND CURETTAGE OF UTERUS  1999   GANGLION CYST EXCISION Right 1992   GASTRIC ROUX-EN-Y N/A 04/14/2013   Procedure: LAPAROSCOPIC ROUX-EN-Y GASTRIC BYPASS WITH UPPER ENDOSCOPY;  Surgeon: Atilano Ina, MD;  Location: [facility];  Service: General;  Laterality: N/A;   TONSILLECTOMY  as child   Social History   Socioeconomic History   Marital status: Married    Spouse name: Not on file   Number of children: 2   Years of education: Not on file   Highest education level: Not on file  Occupational History   Not on file  Tobacco Use   Smoking status: Former    Types: Cigarettes    Quit date: 10/02/1996    Years since quitting: 24.5   Smokeless tobacco: Never  Vaping Use   Vaping Use: Never used  Substance and Sexual Activity   Alcohol use: Yes    Comment: Occasionally; 2-3 beers/week at most (during summer)   Drug use: No   Sexual activity: Not on file  Other Topics Concern   Not on file  Social History Narrative   Not on  file   Social Determinants of Health   Financial Resource Strain: Not on file  Food Insecurity: Not on file  Transportation Needs: Not on file  Physical Activity: Not on file  Stress: Not on file  Social Connections: Not on file  Intimate Partner Violence: Not on file   Family Status  Relation Name Status   Father  Deceased   Mother  Deceased   Sister  Alive   Brother  Alive   Sister  Alive   Daughter  Alive   Son  Alive   Family History  Problem Relation Age of Onset   Diabetes Father    Heart disease Father    Breast cancer Mother    Dementia Mother    Mental retardation Sister    Hip fracture Sister    Healthy Brother    Healthy Sister    Allergies  Allergen Reactions   Tamiflu [Oseltamivir Phosphate] Rash    Patient Care Team: Margaretann Loveless, PA-C as PCP - General (Family Medicine) Himmelrich, Loree Fee, RD (Inactive) as Dietitian [employer])   Medications: Outpatient Medications Prior to Visit  Medication Sig   amoxicillin (AMOXIL) 875 MG tablet Take 1 tablet (875 mg total) by mouth 2 (two) times daily.   Calcium Carbonate-Vitamin D (CALCIUM 500 + D PO) Take 1 Piece by mouth. Chewable once daily   loratadine (CLARITIN) 10 MG tablet Take 10  mg by mouth daily as needed for allergies.    Multiple Vitamins-Minerals (MULTIVITAMIN ADULTS 50+ PO) Take 1 tablet by mouth daily.   Pseudoephedrine-guaiFENesin (MUCINEX D PO) Take by mouth 3 times/day as needed-between meals & bedtime.   SYNTHROID 50 MCG tablet TAKE 1 TABLET DAILY BEFORE BREAKFAST   No facility-administered medications prior to visit.    Review of Systems  Last CBC Lab Results  Component Value Date   WBC 6.4 03/02/2020   HGB 11.8 03/02/2020   HCT 36.1 03/02/2020   MCV 85 03/02/2020   MCH 27.9 03/02/2020   RDW 13.1 03/02/2020   PLT 266 03/02/2020   Last metabolic panel Lab Results  Component Value Date   GLUCOSE 82 03/02/2020   NA 140 03/02/2020   K 4.3 03/02/2020   CL 101  03/02/2020   CO2 23 03/02/2020   BUN 7 03/02/2020   CREATININE 0.73 03/02/2020   GFRNONAA 90 03/02/2020   GFRAA 104 03/02/2020   CALCIUM 9.1 03/02/2020   PROT 6.7 03/02/2020   ALBUMIN 4.3 03/02/2020   LABGLOB 2.4 03/02/2020   AGRATIO 1.8 03/02/2020   BILITOT 0.3 03/02/2020   ALKPHOS 139 (H) 03/02/2020   AST 24 03/02/2020   ALT 12 03/02/2020   Last lipids Lab Results  Component Value Date   CHOL 213 (H) 11/13/2017   HDL 63 11/13/2017   LDLCALC 131 (H) 11/13/2017   TRIG 95 11/13/2017   CHOLHDL 3.4 11/13/2017   Last thyroid functions Lab Results  Component Value Date   TSH 2.750 03/02/2020   T4TOTAL 4.6 10/09/2016      Objective    There were no vitals taken for this visit. BP Readings from Last 3 Encounters:  03/02/20 133/85  08/29/19 124/79  03/07/19 135/80   Wt Readings from Last 3 Encounters:  05/26/20 215 lb 4.8 oz (97.7 kg)  03/02/20 216 lb (98 kg)  08/29/19 200 lb (90.7 kg)      Physical Exam  ***  Last depression screening scores PHQ 2/9 Scores 05/26/2020 03/02/2020 11/12/2017  PHQ - 2 Score 0 0 3  PHQ- 9 Score - - 6   Last fall risk screening Fall Risk  05/26/2020  Falls in the past year? 0  Number falls in past yr: -  Injury with Fall? -  Follow up -   Last Audit-C alcohol use screening Alcohol Use Disorder Test (AUDIT) 03/02/2020  1. How often do you have a drink containing alcohol? 2  2. How many drinks containing alcohol do you have on a typical day when you are drinking? 0  3. How often do you have six or more drinks on one occasion? 0  AUDIT-C Score 2  Alcohol Brief Interventions/Follow-up AUDIT Score <7 follow-up not indicated   A score of 3 or more in women, and 4 or more in men indicates increased risk for alcohol abuse, EXCEPT if all of the points are from question 1   No results found for any visits on 04/19/21.  Assessment & Plan    Routine Health Maintenance and Physical Exam  Exercise Activities and Dietary recommendations   Goals   None     Immunization History  Administered Date(s) Administered   H1N1 08/20/2008   Influenza Split 07/09/2009, 07/21/2010, 07/26/2011, 07/30/2012   Influenza,inj,Quad PF,6+ Mos 09/27/2017, 06/13/2018, 08/12/2019, 07/17/2020   Moderna Sars-Covid-2 Vaccination 10/27/2019, 11/24/2019   Td 11/09/2016   Tdap 01/18/2006    Health Maintenance  Topic Date Due   Zoster Vaccines- Shingrix (1 of  2) Never done   COVID-19 Vaccine (3 - Booster for Moderna series) 04/22/2020   MAMMOGRAM  03/11/2021   INFLUENZA VACCINE  05/02/2021   PAP SMEAR-Modifier  03/03/2023   Fecal DNA (Cologuard)  04/23/2023   TETANUS/TDAP  11/09/2026   Hepatitis C Screening  Completed   HIV Screening  Completed   Pneumococcal Vaccine 67-10 Years old  Aged Out   HPV VACCINES  Aged Out    Discussed health benefits of physical activity, and encouraged her to engage in regular exercise appropriate for her age and condition.  ***  No follow-ups on file.     {provider attestation***:1}   Shirlee Latch, MD  [facility] [phone] (phone) [phone] (fax)  [facility]

## 2021-06-20 ENCOUNTER — Ambulatory Visit: Payer: Self-pay | Admitting: Family Medicine

## 2021-06-27 ENCOUNTER — Other Ambulatory Visit: Payer: Self-pay

## 2021-06-27 ENCOUNTER — Ambulatory Visit (INDEPENDENT_AMBULATORY_CARE_PROVIDER_SITE_OTHER): Payer: Self-pay | Admitting: Family Medicine

## 2021-06-27 ENCOUNTER — Encounter: Payer: Self-pay | Admitting: Family Medicine

## 2021-06-27 VITALS — BP 111/70 | HR 63 | Temp 98.0°F | Resp 16 | Ht 60.5 in | Wt 195.9 lb

## 2021-06-27 DIAGNOSIS — Z0000 Encounter for general adult medical examination without abnormal findings: Secondary | ICD-10-CM

## 2021-06-27 DIAGNOSIS — E559 Vitamin D deficiency, unspecified: Secondary | ICD-10-CM

## 2021-06-27 DIAGNOSIS — F5083 Pica in adults: Secondary | ICD-10-CM

## 2021-06-27 DIAGNOSIS — E669 Obesity, unspecified: Secondary | ICD-10-CM

## 2021-06-27 DIAGNOSIS — Z23 Encounter for immunization: Secondary | ICD-10-CM

## 2021-06-27 DIAGNOSIS — Z1231 Encounter for screening mammogram for malignant neoplasm of breast: Secondary | ICD-10-CM

## 2021-06-27 DIAGNOSIS — F5089 Other specified eating disorder: Secondary | ICD-10-CM

## 2021-06-27 DIAGNOSIS — E039 Hypothyroidism, unspecified: Secondary | ICD-10-CM

## 2021-06-27 DIAGNOSIS — R239 Unspecified skin changes: Secondary | ICD-10-CM

## 2021-06-27 DIAGNOSIS — R739 Hyperglycemia, unspecified: Secondary | ICD-10-CM

## 2021-06-27 DIAGNOSIS — E041 Nontoxic single thyroid nodule: Secondary | ICD-10-CM

## 2021-06-27 DIAGNOSIS — J301 Allergic rhinitis due to pollen: Secondary | ICD-10-CM

## 2021-06-27 MED ORDER — LEVOTHYROXINE SODIUM 50 MCG PO TABS: 50.0000 ug | ORAL_TABLET | Freq: Every day | ORAL | 3 refills | Status: DC

## 2021-06-27 NOTE — Assessment & Plan Note (Signed)
-   Chronic, previously stable - Recheck TSH - Continue Synthroid 

## 2021-06-27 NOTE — Progress Notes (Signed)
Annual Wellness Visit     Patient: Melissa Cherry, Female    DOB: [birth year], 60 y.o.   MRN: [id number] Visit Date: 06/27/2021  Today's Provider: Shirlee Latch, MD   Chief Complaint  Patient presents with   Annual Exam   Subjective    Melissa Cherry is a 60 y.o. female who presents today for her Annual Wellness Visit. She reports consuming a general diet. The patient does not participate in regular exercise at present. She generally feels well. She reports sleeping fairly well. She does not have additional problems to discuss today.   HPI Hypothyroidism - Currently taking Synthroid; requests RF & states that she has been out of Synthroid for the past week - Denies cold intolerance, constipation, & fatigue  Allergic Rhinitis - Currently taking Mucinex prn for allergies - States that she no longer takes Claritin  Age-Related Liver Spots - Pt. requests referral to dermatologist for skin exam  Pica - Pt. reports recent "addiction to eating ice" - Denies fatigue, SOB, tachycardia, & pallor  Health Maintenance - Due for screening mammo; shingles & influenza vaccines  Medications: Outpatient Medications Prior to Visit  Medication Sig   Calcium Carbonate-Vitamin D (CALCIUM 500 + D PO) Take 1 Piece by mouth. Chewable once daily   loratadine (CLARITIN) 10 MG tablet Take 10 mg by mouth daily as needed for allergies.    Multiple Vitamins-Minerals (MULTIVITAMIN ADULTS 50+ PO) Take 1 tablet by mouth daily.   Pseudoephedrine-guaiFENesin (MUCINEX D PO) Take by mouth 3 times/day as needed-between meals & bedtime.   [DISCONTINUED] SYNTHROID 50 MCG tablet TAKE 1 TABLET DAILY BEFORE BREAKFAST   [DISCONTINUED] amoxicillin (AMOXIL) 875 MG tablet Take 1 tablet (875 mg total) by mouth 2 (two) times daily.   No facility-administered medications prior to visit.    Allergies  Allergen Reactions   Tamiflu [Oseltamivir Phosphate] Rash    Patient Care Team: Erasmo Downer, MD as  PCP - General (Family Medicine) Himmelrich, Loree Fee, RD (Inactive) as Dietitian [facility])  Review of Systems  Constitutional:  Negative for activity change, appetite change, fatigue and fever.  HENT: Negative.    Eyes: Negative.   Respiratory:  Negative for chest tightness and shortness of breath.   Cardiovascular: Negative.  Negative for chest pain and leg swelling.  Endocrine: Negative.   Genitourinary: Negative.   Musculoskeletal: Negative.   Skin: Negative.   Neurological: Negative.   Psychiatric/Behavioral:  Positive for sleep disturbance.       Objective    Vitals: BP 111/70 (BP Location: Left Arm, Patient Position: Sitting, Cuff Size: Large)   Pulse 63   Temp 98 F (36.7 C) (Oral)   Resp 16   Ht 5' 0.5" (1.537 m)   Wt 195 lb 14.4 oz (88.9 kg)   BMI 37.63 kg/m     Physical Exam Constitutional:      Appearance: Normal appearance. She is obese.  HENT:     Head: Normocephalic and atraumatic.     Right Ear: External ear normal.     Left Ear: External ear normal.  Eyes:     Conjunctiva/sclera: Conjunctivae normal.  Cardiovascular:     Rate and Rhythm: Normal rate and regular rhythm.     Pulses: Normal pulses.     Heart sounds: Normal heart sounds.  Pulmonary:     Effort: Pulmonary effort is normal.     Breath sounds: Normal breath sounds.  Abdominal:     General: Bowel sounds are normal.  Palpations: Abdomen is soft.  Skin:    General: Skin is warm and dry.  Neurological:     Mental Status: She is alert.  Psychiatric:        Behavior: Behavior normal.        Thought Content: Thought content normal.  Breasts: breasts appear normal, no suspicious masses, no skin or nipple changes or axillary nodes.   Most recent functional status assessment: In your present state of health, do you have any difficulty performing the following activities: 06/27/2021  Hearing? N  Vision? Y  Difficulty concentrating or making decisions? Y  Walking or climbing stairs? N   Dressing or bathing? N  Doing errands, shopping? N  Some recent data might be hidden   Most recent fall risk assessment: Fall Risk  06/27/2021  Falls in the past year? 0  Number falls in past yr: 0  Injury with Fall? 0  Risk for fall due to : No Fall Risks  Follow up Falls evaluation completed    Most recent depression screenings: PHQ 2/9 Scores 06/27/2021 05/26/2020  PHQ - 2 Score 1 0  PHQ- 9 Score 7 -   Most recent cognitive screening: No flowsheet data found. Most recent Audit-C alcohol use screening Alcohol Use Disorder Test (AUDIT) 06/27/2021  1. How often do you have a drink containing alcohol? 1  2. How many drinks containing alcohol do you have on a typical day when you are drinking? 2  3. How often do you have six or more drinks on one occasion? 0  AUDIT-C Score 3  4. How often during the last year have you found that you were not able to stop drinking once you had started? 0  5. How often during the last year have you failed to do what was normally expected from you because of drinking? 0  6. How often during the last year have you needed a first drink in the morning to get yourself going after a heavy drinking session? 0  7. How often during the last year have you had a feeling of guilt of remorse after drinking? 0  8. How often during the last year have you been unable to remember what happened the night before because you had been drinking? 0  9. Have you or someone else been injured as a result of your drinking? 0  10. Has a relative or friend or a doctor or another health worker been concerned about your drinking or suggested you cut down? 0  Alcohol Use Disorder Identification Test Final Score (AUDIT) 3  Alcohol Brief Interventions/Follow-up -   A score of 3 or more in women, and 4 or more in men indicates increased risk for alcohol abuse, EXCEPT if all of the points are from question 1   No results found for any visits on 06/27/21.  Assessment & Plan     Annual  wellness visit done today including the all of the following: Reviewed patient's Family Medical History Reviewed and updated list of patient's medical providers Assessment of cognitive impairment was done Assessed patient's functional ability Established a written schedule for health screening services Health Risk Assessent Completed and Reviewed  Exercise Activities and Dietary recommendations  Goals   None     Immunization History  Administered Date(s) Administered   H1N1 08/20/2008   Influenza Split 07/09/2009, 07/21/2010, 07/26/2011, 07/30/2012   Influenza,inj,Quad PF,6+ Mos 09/27/2017, 06/13/2018, 08/12/2019, 07/17/2020, 06/27/2021   Moderna Sars-Covid-2 Vaccination 10/27/2019, 11/24/2019, 10/06/2020   Td 11/09/2016  Tdap 01/18/2006   Zoster Recombinat (Shingrix) 06/27/2021    Health Maintenance  Topic Date Due   COVID-19 Vaccine (4 - Booster for Moderna series) 02/03/2021   MAMMOGRAM  03/11/2021   Zoster Vaccines- Shingrix (2 of 2) 08/22/2021   Fecal DNA (Cologuard)  04/23/2023   PAP SMEAR-Modifier  03/02/2025   TETANUS/TDAP  11/09/2026   INFLUENZA VACCINE  Completed   Hepatitis C Screening  Completed   HIV Screening  Completed   HPV VACCINES  Aged Out     Discussed health benefits of physical activity, and encouraged her to engage in regular exercise appropriate for her age and condition.    Problem List Items Addressed This Visit       Respiratory   Allergic rhinitis    - Chronic and well-controlled - Continue Mucinex prn for allergies        Endocrine   Hypothyroidism    - Chronic, previously stable - Recheck TSH - Continue Synthroid        Other   Obesity (BMI 30-39.9)    - Chronic, stable - Discussed diet & exercise - Discussed healthy weight management      Avitaminosis D    - Chronic, previously stable - Recheck Vit D - Continue Vit D supplement      Other Visit Diagnoses     Encounter for annual general medical examination  without abnormal findings in adult    -  Primary   - Overall doing well - UTD on pap, Cologuard, Tdap - Encouraged pt. to schedule screening mammo - Follow up in 12 months    Need for influenza vaccination        - Flu vaccine delivered in office today    Relevant Orders   Flu Vaccine QUAD 77mo+IM (Fluarix, Fluzone & Alfiuria Quad PF) (Completed)   Need for shingles vaccine        - Shingles vaccine delivered in office today    Relevant Orders   Varicella-zoster vaccine IM (Completed)   Recent skin changes       - Age-related liver spots, in the setting of chronic sun exposure - Refer to dermatology    Hyperglycemia       - Intermittent episodes of hyperglycemia noted on previous labs without the diagnosis of diabetes - Recheck A1c    Thyroid nodule       - Chronic, previously stable - Continue Synthroid - Recheck TSH    Pica in adults       - Acute, new-onset pica - Denies SOB, fatigue, tachycardia, pallor - Recheck CBC        Return in about 1 year (around 06/27/2022) for CPE and 2nd shingrix in 2-6 months.       Queen Blossom, MS3   Patient seen along with MS3 student Queen Blossom. I personally evaluated this patient along with the student, and verified all aspects of the history, physical exam, and medical decision making as documented by the student. I agree with the student's documentation and have made all necessary edits.  Chandler Stofer, Marzella Schlein, MD, MPH [facility]

## 2021-06-27 NOTE — Assessment & Plan Note (Signed)
-   Chronic, previously stable - Recheck Vit D - Continue Vit D supplement

## 2021-06-27 NOTE — Assessment & Plan Note (Signed)
-   Chronic and well-controlled - Continue Mucinex prn for allergies

## 2021-06-27 NOTE — Assessment & Plan Note (Signed)
-   Chronic, stable - Discussed diet & exercise - Discussed healthy weight management

## 2021-06-28 LAB — COMPREHENSIVE METABOLIC PANEL
ALT: 13 IU/L (ref 0–32)
AST: 26 IU/L (ref 0–40)
Albumin/Globulin Ratio: 2 (ref 1.2–2.2)
Albumin: 4.4 g/dL (ref 3.8–4.9)
Alkaline Phosphatase: 113 IU/L (ref 44–121)
BUN/Creatinine Ratio: 16 (ref 12–28)
BUN: 14 mg/dL (ref 8–27)
Bilirubin Total: 0.2 mg/dL (ref 0.0–1.2)
CO2: 24 mmol/L (ref 20–29)
Calcium: 9.4 mg/dL (ref 8.7–10.3)
Chloride: 106 mmol/L (ref 96–106)
Creatinine, Ser: 0.88 mg/dL (ref 0.57–1.00)
Globulin, Total: 2.2 g/dL (ref 1.5–4.5)
Glucose: 92 mg/dL (ref 70–99)
Potassium: 4.8 mmol/L (ref 3.5–5.2)
Sodium: 144 mmol/L (ref 134–144)
Total Protein: 6.6 g/dL (ref 6.0–8.5)
eGFR: 75 mL/min/{1.73_m2} (ref >59)

## 2021-06-28 LAB — LIPID PANEL
Chol/HDL Ratio: 3.1 ratio (ref 0.0–4.4)
Cholesterol, Total: 230 mg/dL — ABNORMAL HIGH (ref 100–199)
HDL: 74 mg/dL (ref >39)
LDL Chol Calc (NIH): 138 mg/dL — ABNORMAL HIGH (ref 0–99)
Triglycerides: 102 mg/dL (ref 0–149)
VLDL Cholesterol Cal: 18 mg/dL (ref 5–40)

## 2021-06-28 LAB — TSH: TSH: 4.97 u[IU]/mL — ABNORMAL HIGH (ref 0.450–4.500)

## 2021-06-28 LAB — IRON,TIBC AND FERRITIN PANEL
Ferritin: 9 ng/mL — ABNORMAL LOW (ref 15–150)
Iron Saturation: 4 % — CL (ref 15–55)
Iron: 20 ug/dL — ABNORMAL LOW (ref 27–159)
Total Iron Binding Capacity: 513 ug/dL — ABNORMAL HIGH (ref 250–450)
UIBC: 493 ug/dL — ABNORMAL HIGH (ref 131–425)

## 2021-06-28 LAB — CBC
Hematocrit: 33.7 % — ABNORMAL LOW (ref 34.0–46.6)
Hemoglobin: 10.8 g/dL — ABNORMAL LOW (ref 11.1–15.9)
MCH: 25.4 pg — ABNORMAL LOW (ref 26.6–33.0)
MCHC: 32 g/dL (ref 31.5–35.7)
MCV: 79 fL (ref 79–97)
Platelets: 268 10*3/uL (ref 150–450)
RBC: 4.26 x10E6/uL (ref 3.77–5.28)
RDW: 13.9 % (ref 11.7–15.4)
WBC: 6.8 10*3/uL (ref 3.4–10.8)

## 2021-06-28 LAB — HEMOGLOBIN A1C
Est. average glucose Bld gHb Est-mCnc: 114 mg/dL
Hgb A1c MFr Bld: 5.6 % (ref 4.8–5.6)

## 2021-07-04 LAB — HM MAMMOGRAPHY

## 2021-09-05 ENCOUNTER — Ambulatory Visit (INDEPENDENT_AMBULATORY_CARE_PROVIDER_SITE_OTHER): Payer: BC Managed Care – PPO | Admitting: Family Medicine

## 2021-09-05 ENCOUNTER — Encounter: Payer: Self-pay | Admitting: Family Medicine

## 2021-09-05 ENCOUNTER — Other Ambulatory Visit: Payer: Self-pay

## 2021-09-05 DIAGNOSIS — Z23 Encounter for immunization: Secondary | ICD-10-CM | POA: Diagnosis not present

## 2021-09-05 NOTE — Progress Notes (Signed)
Patient here for Shingrix vaccination only.  I did not examine the patient.  I did review her medical history, medications, and allergies and vaccine consent form.  CMA gave vaccination. Patient tolerated well.  Erasmo Downer, MD, MPH [facility] 09/05/2021 11:12 AM

## 2021-11-17 ENCOUNTER — Ambulatory Visit: Payer: BC Managed Care – PPO | Admitting: Dermatology

## 2022-06-22 ENCOUNTER — Telehealth: Payer: Self-pay

## 2022-06-22 NOTE — Telephone Encounter (Signed)
Called patient to let her know she has an order for mammogram, and to just call [place] to schedule an appt.

## 2022-06-22 NOTE — Telephone Encounter (Signed)
Called, spoke with patient. Advised her that mammogram order was placed and to call and make OV. Provided patient with number of [facility] mammogram imaging.

## 2022-06-29 ENCOUNTER — Encounter: Payer: Self-pay | Admitting: Family Medicine

## 2022-06-30 ENCOUNTER — Encounter: Payer: Self-pay | Admitting: Family Medicine

## 2022-06-30 ENCOUNTER — Ambulatory Visit (INDEPENDENT_AMBULATORY_CARE_PROVIDER_SITE_OTHER): Payer: BC Managed Care – PPO | Admitting: Family Medicine

## 2022-06-30 VITALS — BP 128/85 | HR 69 | Temp 98.7°F | Resp 16 | Ht 61.0 in | Wt 207.8 lb

## 2022-06-30 DIAGNOSIS — E538 Deficiency of other specified B group vitamins: Secondary | ICD-10-CM | POA: Diagnosis not present

## 2022-06-30 DIAGNOSIS — E785 Hyperlipidemia, unspecified: Secondary | ICD-10-CM | POA: Diagnosis not present

## 2022-06-30 DIAGNOSIS — M25512 Pain in left shoulder: Secondary | ICD-10-CM | POA: Insufficient documentation

## 2022-06-30 DIAGNOSIS — Z0001 Encounter for general adult medical examination with abnormal findings: Secondary | ICD-10-CM | POA: Insufficient documentation

## 2022-06-30 DIAGNOSIS — Z23 Encounter for immunization: Secondary | ICD-10-CM | POA: Diagnosis not present

## 2022-06-30 DIAGNOSIS — D508 Other iron deficiency anemias: Secondary | ICD-10-CM

## 2022-06-30 DIAGNOSIS — E039 Hypothyroidism, unspecified: Secondary | ICD-10-CM | POA: Diagnosis not present

## 2022-06-30 DIAGNOSIS — I83893 Varicose veins of bilateral lower extremities with other complications: Secondary | ICD-10-CM

## 2022-06-30 DIAGNOSIS — D649 Anemia, unspecified: Secondary | ICD-10-CM | POA: Insufficient documentation

## 2022-06-30 DIAGNOSIS — Z0000 Encounter for general adult medical examination without abnormal findings: Secondary | ICD-10-CM

## 2022-06-30 DIAGNOSIS — R239 Unspecified skin changes: Secondary | ICD-10-CM | POA: Insufficient documentation

## 2022-06-30 MED ORDER — MELOXICAM 7.5 MG PO TABS: 7.5000 mg | ORAL_TABLET | Freq: Every day | ORAL | 0 refills | Status: DC

## 2022-06-30 NOTE — Patient Instructions (Signed)
Health Maintenance for Postmenopausal Women Menopause is a normal process in which your ability to get pregnant comes to an end. This process happens slowly over many months or years, usually between the ages of 48 and 55. Menopause is complete when you have missed your menstrual period for 12 months. It is important to talk with your health care provider about some of the most common conditions that affect women after menopause (postmenopausal women). These include heart disease, cancer, and bone loss (osteoporosis). Adopting a healthy lifestyle and getting preventive care can help to promote your health and wellness. The actions you take can also lower your chances of developing some of these common conditions. What are the signs and symptoms of menopause? During menopause, you may have the following symptoms: Hot flashes. These can be moderate or severe. Night sweats. Decrease in sex drive. Mood swings. Headaches. Tiredness (fatigue). Irritability. Memory problems. Problems falling asleep or staying asleep. Talk with your health care provider about treatment options for your symptoms. Do I need hormone replacement therapy? Hormone replacement therapy is effective in treating symptoms that are caused by menopause, such as hot flashes and night sweats. Hormone replacement carries certain risks, especially as you become older. If you are thinking about using estrogen or estrogen with progestin, discuss the benefits and risks with your health care provider. How can I reduce my risk for heart disease and stroke? The risk of heart disease, heart attack, and stroke increases as you age. One of the causes may be a change in the body's hormones during menopause. This can affect how your body uses dietary fats, triglycerides, and cholesterol. Heart attack and stroke are medical emergencies. There are many things that you can do to help prevent heart disease and stroke. Watch your blood pressure High  blood pressure causes heart disease and increases the risk of stroke. This is more likely to develop in people who have high blood pressure readings or are overweight. Have your blood pressure checked: Every 3-5 years if you are 18-39 years of age. Every year if you are 40 years old or older. Eat a healthy diet  Eat a diet that includes plenty of vegetables, fruits, low-fat dairy products, and lean protein. Do not eat a lot of foods that are high in solid fats, added sugars, or sodium. Get regular exercise Get regular exercise. This is one of the most important things you can do for your health. Most adults should: Try to exercise for at least 150 minutes each week. The exercise should increase your heart rate and make you sweat (moderate-intensity exercise). Try to do strengthening exercises at least twice each week. Do these in addition to the moderate-intensity exercise. Spend less time sitting. Even light physical activity can be beneficial. Other tips Work with your health care provider to achieve or maintain a healthy weight. Do not use any products that contain nicotine or tobacco. These products include cigarettes, chewing tobacco, and vaping devices, such as e-cigarettes. If you need help quitting, ask your health care provider. Know your numbers. Ask your health care provider to check your cholesterol and your blood sugar (glucose). Continue to have your blood tested as directed by your health care provider. Do I need screening for cancer? Depending on your health history and family history, you may need to have cancer screenings at different stages of your life. This may include screening for: Breast cancer. Cervical cancer. Lung cancer. Colorectal cancer. What is my risk for osteoporosis? After menopause, you may be   at increased risk for osteoporosis. Osteoporosis is a condition in which bone destruction happens more quickly than new bone creation. To help prevent osteoporosis or  the bone fractures that can happen because of osteoporosis, you may take the following actions: If you are 19-50 years old, get at least 1,000 mg of calcium and at least 600 international units (IU) of vitamin D per day. If you are older than age 50 but younger than age 70, get at least 1,200 mg of calcium and at least 600 international units (IU) of vitamin D per day. If you are older than age 70, get at least 1,200 mg of calcium and at least 800 international units (IU) of vitamin D per day. Smoking and drinking excessive alcohol increase the risk of osteoporosis. Eat foods that are rich in calcium and vitamin D, and do weight-bearing exercises several times each week as directed by your health care provider. How does menopause affect my mental health? Depression may occur at any age, but it is more common as you become older. Common symptoms of depression include: Feeling depressed. Changes in sleep patterns. Changes in appetite or eating patterns. Feeling an overall lack of motivation or enjoyment of activities that you previously enjoyed. Frequent crying spells. Talk with your health care provider if you think that you are experiencing any of these symptoms. General instructions See your health care provider for regular wellness exams and vaccines. This may include: Scheduling regular health, dental, and eye exams. Getting and maintaining your vaccines. These include: Influenza vaccine. Get this vaccine each year before the flu season begins. Pneumonia vaccine. Shingles vaccine. Tetanus, diphtheria, and pertussis (Tdap) booster vaccine. Your health care provider may also recommend other immunizations. Tell your health care provider if you have ever been abused or do not feel safe at home. Summary Menopause is a normal process in which your ability to get pregnant comes to an end. This condition causes hot flashes, night sweats, decreased interest in sex, mood swings, headaches, or lack  of sleep. Treatment for this condition may include hormone replacement therapy. Take actions to keep yourself healthy, including exercising regularly, eating a healthy diet, watching your weight, and checking your blood pressure and blood sugar levels. Get screened for cancer and depression. Make sure that you are up to date with all your vaccines. This information is not intended to replace advice given to you by your health care provider. Make sure you discuss any questions you have with your health care provider. Document Revised: 02/07/2021 Document Reviewed: 02/07/2021 Elsevier Patient Education  2023 Elsevier Inc.  

## 2022-06-30 NOTE — Assessment & Plan Note (Addendum)
Previously elevated LDL We will check lipid panel today She is not currently on statin therapy

## 2022-06-30 NOTE — Progress Notes (Signed)
Complete physical exam  I,Melissa Cherry,acting as a scribe for [employer], MD.,have documented all relevant documentation on the behalf of Melissa Foster, MD,as directed by  Melissa Foster, MD while in the presence of Melissa Foster, MD.   Patient: Melissa Cherry   DOB: [id number]   61 y.o. Female  MRN: [id number] Visit Date: 06/30/2022  Today's healthcare provider: [name], MD   Chief Complaint  Patient presents with   Annual Exam   Subjective    [name] is a 61 y.o. female who presents today for a complete physical exam.  She reports consuming a general diet. The patient does not participate in regular exercise at present. She generally feels fairly well. She reports sleeping poorly. She does have additional problems to discuss today.  HPI  Shoulder Pain:  Patient complaints of left shoulder pain. This is evaluated as a  no  injury. The pain is described as  pressure .  The onset of the pain was sudden, related to renovation at home and caring for her adult sister. Mechanism of injury: none.  The pain occurs every day . Symptoms are aggravated by no limitations. Symptoms are diminished by  medication: IBU.   Limited activities include: no limitations. No stiffness, mild weakness, no swelling is reported.      Varicose Veins   Leg Veins are itching and would like a referral to Vascular. She reports that she has noticed her veins appear more prominent. She denies pain in her legs or symptoms of claudication.    Skin Changes Need a new dermatology referral to [facility] or [facility]. She reports that her mother had skin cancer that was removed from her nose. She states she has noticed skin changes and would like to be evaluated by derm. She does not report any wounds or drainage from the skin changes.   Past Medical History:  Diagnosis Date   Complication of anesthesia    too high of episural   GERD (gastroesophageal reflux  disease)    Hypercholesteremia    Hypothyroidism    Morbid obesity (HCC)    OSA (obstructive sleep apnea)    Past Surgical History:  Procedure Laterality Date   CESAREAN SECTION  2000, 2003   DILATION AND CURETTAGE OF UTERUS  1999   GANGLION CYST EXCISION Right 1992   GASTRIC ROUX-EN-Y N/A 04/14/2013   Procedure: LAPAROSCOPIC ROUX-EN-Y GASTRIC BYPASS WITH UPPER ENDOSCOPY;  Surgeon: Gayland Curry, MD;  Location: [facility];  Service: General;  Laterality: N/A;   TONSILLECTOMY  as child   Social History   Socioeconomic History   Marital status: Married    Spouse name: Not on file   Number of children: 2   Years of education: Not on file   Highest education level: Not on file  Occupational History   Not on file  Tobacco Use   Smoking status: Former    Types: Cigarettes    Quit date: 10/02/1996    Years since quitting: 25.7   Smokeless tobacco: Never  Vaping Use   Vaping Use: Never used  Substance and Sexual Activity   Alcohol use: Yes    Comment: Occasionally; 2-3 beers/week at most (during summer)   Drug use: No   Sexual activity: Not on file  Other Topics Concern   Not on file  Social History Narrative   Not on file   Social Determinants of Health   Financial Resource Strain: Not on file  Food Insecurity: Not  on file  Transportation Needs: Not on file  Physical Activity: Not on file  Stress: Not on file  Social Connections: Not on file  Intimate Partner Violence: Not on file   Family Status  Relation Name Status   Father  Deceased   Mother  Deceased   Sister  Alive   Brother  Alive   Sister  Alive   Daughter  Alive   Son  Alive   Family History  Problem Relation Age of Onset   Diabetes Father    Heart disease Father    Breast cancer Mother    Dementia Mother    Mental retardation Sister    Hip fracture Sister    Healthy Brother    Healthy Sister    Allergies  Allergen Reactions   Tamiflu [Oseltamivir Phosphate] Rash    Patient Care  Team: Bacigalupo, Dionne Bucy, MD as PCP - General (Family Medicine) Himmelrich, Bryson Ha, RD (Inactive) as Dietitian Psychologist, prison and probation services)   Medications: Outpatient Medications Prior to Visit  Medication Sig   Calcium Carbonate-Vitamin D (CALCIUM 500 + D PO) Take 1 Piece by mouth. Chewable once daily   levothyroxine (SYNTHROID) 50 MCG tablet Take 1 tablet (50 mcg total) by mouth daily before breakfast.   loratadine (CLARITIN) 10 MG tablet Take 10 mg by mouth daily as needed for allergies.    Multiple Vitamins-Minerals (MULTIVITAMIN ADULTS 50+ PO) Take 1 tablet by mouth daily.   [DISCONTINUED] Pseudoephedrine-guaiFENesin (MUCINEX D PO) Take by mouth 3 times/day as needed-between meals & bedtime. (Patient not taking: Reported on 06/30/2022)   No facility-administered medications prior to visit.    Review of Systems  Constitutional:  Positive for fatigue.  Endocrine: Positive for cold intolerance.  Musculoskeletal:  Positive for arthralgias and myalgias.  Allergic/Immunologic: Positive for environmental allergies.  All other systems reviewed and are negative.     Objective    BP 128/85 (BP Location: Left Arm, Patient Position: Sitting, Cuff Size: Large)   Pulse 69   Temp 98.7 F (37.1 C) (Oral)   Resp 16   Ht 5\' 1"  (1.549 m)   Wt 207 lb 12.8 oz (94.3 kg)   BMI 39.26 kg/m     Physical Exam Vitals reviewed.  Constitutional:      General: She is not in acute distress.    Appearance: Normal appearance. She is not ill-appearing, toxic-appearing or diaphoretic.  HENT:     Head: Normocephalic and atraumatic.     Right Ear: Tympanic membrane and external ear normal. There is no impacted cerumen.     Left Ear: Tympanic membrane and external ear normal. There is no impacted cerumen.     Nose: Nose normal.     Mouth/Throat:     Pharynx: Oropharynx is clear.  Eyes:     General: No scleral icterus.    Extraocular Movements: Extraocular movements intact.     Conjunctiva/sclera: Conjunctivae  normal.     Pupils: Pupils are equal, round, and reactive to light.  Cardiovascular:     Rate and Rhythm: Normal rate and regular rhythm.     Pulses: Normal pulses.     Heart sounds: Normal heart sounds. No murmur heard.    No friction rub. No gallop.  Pulmonary:     Effort: Pulmonary effort is normal. No respiratory distress.     Breath sounds: Normal breath sounds. No wheezing, rhonchi or rales.  Abdominal:     General: Bowel sounds are normal. There is no distension.     Palpations:  Abdomen is soft. There is no mass.     Tenderness: There is no abdominal tenderness. There is no guarding.  Musculoskeletal:        General: No deformity.     Right shoulder: Normal.     Left shoulder: Tenderness present. No swelling or deformity. Normal range of motion. Normal strength. Normal pulse.     Cervical back: Normal range of motion and neck supple. No rigidity.     Right lower leg: No edema.     Left lower leg: No edema.     Comments: Varicose veins noted on bilateral lower extremities    Shoulder: Inspection reveals no obvious deformity, atrophy, or asymmetry. No bruising. No swelling Palpation is normal with no TTP over [facility] joint or bicipital groove. Full ROM in flexion, abduction, internal/external rotation NV intact distally  Special Tests:  - Impingement: Neg Hawkins, neers, empty can sign. - Supraspinatous: Negative empty can.  5/5 strength with resisted flexion at 20 degrees - Infraspinatous/Teres Minor: 5/5 strength with ER. Negative pain with resisted ER - Biceps tendon: Negative Speeds - AC Joint: Negative cross arm - Negative apprehension test - No painful arc and no drop arm sign    Lymphadenopathy:     Cervical: No cervical adenopathy.  Skin:    General: Skin is warm.     Capillary Refill: Capillary refill takes less than 2 seconds.     Findings: No erythema or rash.     Comments: Hyperpigmented macules noted No melnocytic lesions observed on extremities today  No  wounds   Neurological:     General: No focal deficit present.     Mental Status: She is alert and oriented to person, place, and time.     Motor: No weakness.     Coordination: Coordination normal.     Gait: Gait normal.  Psychiatric:        Mood and Affect: Mood normal.        Behavior: Behavior normal.       Last depression screening scores    06/27/2021    3:19 PM 05/26/2020   11:16 AM 03/02/2020    1:38 PM  PHQ 2/9 Scores  PHQ - 2 Score 1 0 0  PHQ- 9 Score 7     Last fall risk screening    06/30/2022    3:16 PM  Beatrice in the past year? 0  Number falls in past yr: 0  Injury with Fall? 0  Risk for fall due to : No Fall Risks   Last Audit-C alcohol use screening    06/30/2022    3:16 PM  Alcohol Use Disorder Test (AUDIT)  1. How often do you have a drink containing alcohol? 4  2. How many drinks containing alcohol do you have on a typical day when you are drinking? 0  3. How often do you have six or more drinks on one occasion? 0  AUDIT-C Score 4  4. How often during the last year have you found that you were not able to stop drinking once you had started? 0  5. How often during the last year have you failed to do what was normally expected from you because of drinking? 0  6. How often during the last year have you needed a first drink in the morning to get yourself going after a heavy drinking session? 0  7. How often during the last year have you had a feeling of guilt of remorse  after drinking? 0  8. How often during the last year have you been unable to remember what happened the night before because you had been drinking? 0  9. Have you or someone else been injured as a result of your drinking? 0  10. Has a relative or friend or a doctor or another health worker been concerned about your drinking or suggested you cut down? 0  Alcohol Use Disorder Identification Test Final Score (AUDIT) 4   A score of 3 or more in women, and 4 or more in men indicates  increased risk for alcohol abuse, EXCEPT if all of the points are from question 1   No results found for any visits on 06/30/22.  Assessment & Plan    Routine Health Maintenance and Physical Exam  Exercise Activities and Dietary recommendations  Goals   None     Immunization History  Administered Date(s) Administered   H1N1 08/20/2008   Influenza Split 07/09/2009, 07/21/2010, 07/26/2011, 07/30/2012   Influenza,inj,Quad PF,6+ Mos 09/27/2017, 06/13/2018, 08/12/2019, 07/17/2020, 06/27/2021, 06/30/2022   Moderna Sars-Covid-2 Vaccination 10/27/2019, 11/24/2019, 10/06/2020   Td 11/09/2016   Tdap 01/18/2006   Zoster Recombinat (Shingrix) 06/27/2021, 09/05/2021    Health Maintenance  Topic Date Due   COVID-19 Vaccine (4 - Moderna series) 12/01/2020   MAMMOGRAM  07/04/2022   Fecal DNA (Cologuard)  04/23/2023   PAP SMEAR-Modifier  03/02/2025   TETANUS/TDAP  11/09/2026   INFLUENZA VACCINE  Completed   Hepatitis C Screening  Completed   HIV Screening  Completed   Zoster Vaccines- Shingrix  Completed   HPV VACCINES  Aged Out    Discussed health benefits of physical activity, and encouraged her to engage in regular exercise appropriate for her age and condition.  Problem List Items Addressed This Visit       Cardiovascular and Mediastinum   Varicose veins of bilateral lower extremities with other complications   Relevant Orders   Ambulatory referral to Vascular Surgery     Endocrine   Hypothyroidism    Chronic, controlled  She will continue synthroid 55mcg daily  Will check TSH and free T4 today        Relevant Orders   TSH + free T4     Other   Dyslipidemia    Previously elevated LDL We will check lipid panel today She is not currently on statin therapy      Relevant Orders   Lipid panel   Encounter for routine adult physical exam with abnormal findings - Primary    We will check CMP, lipid panel, TSH Patient to receive influenza vaccine today      Recent  skin changes    fam hx of skin cancer  Referral to dermatology (patient requests [facility] or [facility])       Relevant Orders   Ambulatory referral to Dermatology   Absolute anemia    We will check vitamin B12 levels as well as CBC and iron panel today      Relevant Orders   Iron, TIBC and Ferritin Panel   B12 deficiency    We will check vitamin B12 levels today      Relevant Orders   Vitamin B12   Acute pain of left shoulder    Acute, new problem  Suspect this is related to increased use and lifting while helping with renovation of home  Will have her take meloxicam 7.5mg  daily for [redacted] weeks along with heat therapy and rest from lifting and pushing  Recommended follow  up if symptoms persist, can consider imaging if this is the case        Other Visit Diagnoses     Need for immunization against influenza       Relevant Orders   Flu Vaccine QUAD 36mo+IM (Fluarix, Fluzone & Alfiuria Quad PF) (Completed)        Return in about 6 months (around 12/29/2022).     I, Melissa Foster, MD, have reviewed all documentation for this visit. The documentation on 06/30/22 for the exam, diagnosis, procedures, and orders are all accurate and complete.    Melissa Foster, MD  [place] [phone] (phone) [phone] (fax)  [place]

## 2022-06-30 NOTE — Assessment & Plan Note (Signed)
Chronic, controlled  She will continue synthroid 70mcg daily  Will check TSH and free T4 today

## 2022-06-30 NOTE — Assessment & Plan Note (Signed)
We will check vitamin B12 levels as well as CBC and iron panel today

## 2022-06-30 NOTE — Assessment & Plan Note (Signed)
fam hx of skin cancer  Referral to dermatology (patient requests [facility] or [facility])

## 2022-06-30 NOTE — Assessment & Plan Note (Signed)
We will check CMP, lipid panel, TSH Patient to receive influenza vaccine today

## 2022-06-30 NOTE — Assessment & Plan Note (Signed)
Acute, new problem  Suspect this is related to increased use and lifting while helping with renovation of home  Will have her take meloxicam 7.5mg  daily for [redacted] weeks along with heat therapy and rest from lifting and pushing  Recommended follow up if symptoms persist, can consider imaging if this is the case

## 2022-06-30 NOTE — Assessment & Plan Note (Signed)
We will check vitamin B12 levels today

## 2022-07-01 LAB — CBC WITH DIFFERENTIAL/PLATELET
Basophils Absolute: 0.1 10*3/uL (ref 0.0–0.2)
Basos: 1 %
EOS (ABSOLUTE): 0.1 10*3/uL (ref 0.0–0.4)
Eos: 2 %
Hematocrit: 41.1 % (ref 34.0–46.6)
Hemoglobin: 13.4 g/dL (ref 11.1–15.9)
Immature Grans (Abs): 0 10*3/uL (ref 0.0–0.1)
Immature Granulocytes: 0 %
Lymphocytes Absolute: 2.1 10*3/uL (ref 0.7–3.1)
Lymphs: 37 %
MCH: 30.4 pg (ref 26.6–33.0)
MCHC: 32.6 g/dL (ref 31.5–35.7)
MCV: 93 fL (ref 79–97)
Monocytes Absolute: 0.6 10*3/uL (ref 0.1–0.9)
Monocytes: 10 %
Neutrophils Absolute: 2.8 10*3/uL (ref 1.4–7.0)
Neutrophils: 50 %
Platelets: 284 10*3/uL (ref 150–450)
RBC: 4.41 x10E6/uL (ref 3.77–5.28)
RDW: 12.7 % (ref 11.7–15.4)
WBC: 5.7 10*3/uL (ref 3.4–10.8)

## 2022-07-01 LAB — IRON,TIBC AND FERRITIN PANEL
Ferritin: 20 ng/mL (ref 15–150)
Iron Saturation: 34 % (ref 15–55)
Iron: 158 ug/dL — ABNORMAL HIGH (ref 27–139)
Total Iron Binding Capacity: 468 ug/dL — ABNORMAL HIGH (ref 250–450)
UIBC: 310 ug/dL (ref 118–369)

## 2022-07-01 LAB — COMPREHENSIVE METABOLIC PANEL
ALT: 14 IU/L (ref 0–32)
AST: 23 IU/L (ref 0–40)
Albumin/Globulin Ratio: 1.9 (ref 1.2–2.2)
Albumin: 4.4 g/dL (ref 3.9–4.9)
Alkaline Phosphatase: 115 IU/L (ref 44–121)
BUN/Creatinine Ratio: 18 (ref 12–28)
BUN: 13 mg/dL (ref 8–27)
Bilirubin Total: 0.3 mg/dL (ref 0.0–1.2)
CO2: 26 mmol/L (ref 20–29)
Calcium: 9.4 mg/dL (ref 8.7–10.3)
Chloride: 102 mmol/L (ref 96–106)
Creatinine, Ser: 0.73 mg/dL (ref 0.57–1.00)
Globulin, Total: 2.3 g/dL (ref 1.5–4.5)
Glucose: 92 mg/dL (ref 70–99)
Potassium: 4.8 mmol/L (ref 3.5–5.2)
Sodium: 141 mmol/L (ref 134–144)
Total Protein: 6.7 g/dL (ref 6.0–8.5)
eGFR: 94 mL/min/{1.73_m2} (ref >59)

## 2022-07-01 LAB — LIPID PANEL
Chol/HDL Ratio: 3 ratio (ref 0.0–4.4)
Cholesterol, Total: 222 mg/dL — ABNORMAL HIGH (ref 100–199)
HDL: 75 mg/dL (ref >39)
LDL Chol Calc (NIH): 132 mg/dL — ABNORMAL HIGH (ref 0–99)
Triglycerides: 84 mg/dL (ref 0–149)
VLDL Cholesterol Cal: 15 mg/dL (ref 5–40)

## 2022-07-01 LAB — TSH+FREE T4
Free T4: 0.94 ng/dL (ref 0.82–1.77)
TSH: 2.84 u[IU]/mL (ref 0.450–4.500)

## 2022-07-01 LAB — VITAMIN B12: Vitamin B-12: 268 pg/mL (ref 232–1245)

## 2022-07-05 DIAGNOSIS — Z1231 Encounter for screening mammogram for malignant neoplasm of breast: Secondary | ICD-10-CM | POA: Diagnosis not present

## 2022-11-28 ENCOUNTER — Telehealth: Payer: Self-pay | Admitting: Family Medicine

## 2022-11-28 NOTE — Telephone Encounter (Signed)
Pt called saying she needs a new prescription for her thyroid medication.  She states she has not been taking it like she should .  I suggested she make an appt but she declined.  She said she has had labs recently.  She had labs in September when she had her CPE.   Please advise.  [phone]

## 2022-11-30 ENCOUNTER — Other Ambulatory Visit: Payer: Self-pay

## 2022-11-30 DIAGNOSIS — E039 Hypothyroidism, unspecified: Secondary | ICD-10-CM

## 2022-11-30 DIAGNOSIS — E041 Nontoxic single thyroid nodule: Secondary | ICD-10-CM

## 2022-11-30 MED ORDER — LEVOTHYROXINE SODIUM 50 MCG PO TABS: 50.0000 ug | ORAL_TABLET | Freq: Every day | ORAL | 0 refills | Status: DC

## 2022-11-30 NOTE — Telephone Encounter (Signed)
Dr Quentin Cornwall had recommended f/u in 6 months when seen for CPE in 9/23 anyways.  Ok to fill synthroid for 1 month and then she needs to be seen for f/u appt as discussed

## 2022-12-18 ENCOUNTER — Ambulatory Visit: Payer: Self-pay

## 2022-12-18 ENCOUNTER — Telehealth: Payer: Self-pay

## 2022-12-18 NOTE — Progress Notes (Unsigned)
    Argentina Ponder De[place]anto,acting as a scribe for Lelon Huh, MD.,have documented all relevant documentation on the behalf of Lelon Huh, MD,as directed by  Lelon Huh, MD while in the presence of Lelon Huh, MD.    Established patient visit   Patient: Melissa Cherry   DOB: [id number]   62 y.o. Female  MRN: [id number] Visit Date: 12/19/2022  Today's healthcare provider: Lelon Huh, MD   No chief complaint on file.  Subjective    HPI  Patient is a 62 year old female who presents for evaluation after receiving a dog bite. She states she was bit by her neighbor's dog on 12/17/22. She cleaned wound thoroughly with soapy water and H2O2. Has since been applying OTC triple antibiotic ointment.  She has on her right outer calf one puncture that also has about a 1 in scrape to it and then 3 other minimal scratches.   Patient states that per the neighbor the dog is UTD on vaccines.  Medications: Outpatient Medications Prior to Visit  Medication Sig   Calcium Carbonate-Vitamin D (CALCIUM 500 + D PO) Take 1 Piece by mouth. Chewable once daily   levothyroxine (SYNTHROID) 50 MCG tablet Take 1 tablet (50 mcg total) by mouth daily before breakfast.   loratadine (CLARITIN) 10 MG tablet Take 10 mg by mouth daily as needed for allergies.    meloxicam (MOBIC) 7.5 MG tablet Take 1 tablet (7.5 mg total) by mouth daily.   Multiple Vitamins-Minerals (MULTIVITAMIN ADULTS 50+ PO) Take 1 tablet by mouth daily.   No facility-administered medications prior to visit.    Review of Systems  Skin:  Positive for color change and wound. Negative for pallor and rash.       Objective    BP 137/64 (BP Location: Left Arm, Patient Position: Sitting, Cuff Size: Large)   Pulse 67   Wt 212 lb (96.2 kg)   SpO2 96%   BMI 40.06 kg/m    Physical Exam  2cm shallow laceration and puncture wound on right lateral mid calf, scabbed over with no drainage or surrounding erythema.   Assessment & Plan     1. Dog  bite of right lower leg, initial encounter   2. Puncture wound of right lower leg, initial encounter   3. Varicose veins of bilateral lower extremities with other complications Was previously referred by Dr. Alba Cory, but was never contacted by [facility]. Advised to call there office if she she does not get a call by the end of the week.  - Ambulatory referral to Vascular Surgery  4. Need for prophylactic vaccination using tetanus and diphtheria toxoids adsorbed (Td) vaccine  - Td vaccine preservative free greater than or equal to 7yo IM       The entirety of the information documented in the History of Present Illness, Review of Systems and Physical Exam were personally obtained by me. Portions of this information were initially documented by the CMA and reviewed by me for thoroughness and accuracy.     Lelon Huh, MD  [place] [phone] (phone) [phone] (fax)  [place]

## 2022-12-18 NOTE — Telephone Encounter (Signed)
Copied from [place] [id number]. Topic: General - Other >> Dec 18, 2022  1:34 PM [place] B wrote: Reason for CRM: patient called in wants to know when the last time she had a tetanus shot.

## 2022-12-18 NOTE — Telephone Encounter (Signed)
  Chief Complaint: Dog bite Symptoms: Puncture, small scrape Frequency: yesterday Pertinent Negatives: Patient denies  Disposition: [] ED /[] Urgent Care (no appt availability in office) / [x] Appointment(In office/virtual)/ []  [facility]/ [] Home Care/ [] Refused Recommended Disposition /[] [place] [facility]/ []  Follow-up with PCP Additional Notes: PT was bitten by neighbor's dog yesterday. Bite is 2 puncture wounds and a scrape. Pt unsure of last tetanus shot.    Summary: dog bite   Patient had dog bite yesterday, no symptoms just sore, and dog has had rabies shot, she said     Reason for Disposition  [1] Last tetanus shot > 5 years ago AND [2] any wound (e.g., cut, scrape)  Answer Assessment - Initial Assessment Questions 1. ANIMAL: "What type of animal caused the bite?" "Is the injury from a bite or a claw?" If the animal is a dog or a cat, ask: "Was it a pet or a stray?" "Was it acting ill or behaving strangely?"     Next door neighbor's dog 2. LOCATION: "Where is the bite located?"      Right leg 3. SIZE: "How big is the bite?" "What does it look like?"      2 puncture wounds.  4. ONSET: "When did the bite happen?" (Minutes or hours ago)      Yesterday 5. CIRCUMSTANCES: "Tell me how this happened."      Dog nipped pt at door 6. TETANUS: "When was your last tetanus booster?"     Unsure 7. RABIES VACCINE: For dog or cat bites, ask: "Do you know if the pet is vaccinated against rabies?"  (e.g., yes, no, overdue for rabies shot, unknown)     Per neighbor dog is vaccinated  Protocols used: Animal Bite-A-AH

## 2022-12-19 ENCOUNTER — Ambulatory Visit (INDEPENDENT_AMBULATORY_CARE_PROVIDER_SITE_OTHER): Payer: BC Managed Care – PPO | Admitting: Family Medicine

## 2022-12-19 VITALS — BP 137/64 | HR 67 | Wt 212.0 lb

## 2022-12-19 DIAGNOSIS — W540XXA Bitten by dog, initial encounter: Secondary | ICD-10-CM

## 2022-12-19 DIAGNOSIS — S81831A Puncture wound without foreign body, right lower leg, initial encounter: Secondary | ICD-10-CM

## 2022-12-19 DIAGNOSIS — Z23 Encounter for immunization: Secondary | ICD-10-CM

## 2022-12-19 DIAGNOSIS — S81851A Open bite, right lower leg, initial encounter: Secondary | ICD-10-CM

## 2022-12-19 DIAGNOSIS — I83893 Varicose veins of bilateral lower extremities with other complications: Secondary | ICD-10-CM

## 2022-12-19 NOTE — Patient Instructions (Signed)
.   Please review the attached list of medications and notify my office if there are any errors.   . Please bring all of your medications to every appointment so we can make sure that our medication list is the same as yours.   

## 2023-01-19 ENCOUNTER — Ambulatory Visit (INDEPENDENT_AMBULATORY_CARE_PROVIDER_SITE_OTHER): Payer: BC Managed Care – PPO | Admitting: Vascular Surgery

## 2023-01-19 ENCOUNTER — Encounter (INDEPENDENT_AMBULATORY_CARE_PROVIDER_SITE_OTHER): Payer: Self-pay | Admitting: Vascular Surgery

## 2023-01-19 VITALS — BP 141/83 | HR 67 | Resp 18 | Ht 61.0 in | Wt 211.2 lb

## 2023-01-19 DIAGNOSIS — I83893 Varicose veins of bilateral lower extremities with other complications: Secondary | ICD-10-CM | POA: Diagnosis not present

## 2023-01-19 DIAGNOSIS — E785 Hyperlipidemia, unspecified: Secondary | ICD-10-CM | POA: Diagnosis not present

## 2023-01-19 DIAGNOSIS — E669 Obesity, unspecified: Secondary | ICD-10-CM

## 2023-01-19 NOTE — Assessment & Plan Note (Signed)
lipid control important in reducing the progression of atherosclerotic disease.   

## 2023-01-19 NOTE — Patient Instructions (Signed)

## 2023-01-19 NOTE — Progress Notes (Signed)
Patient ID: Melissa Cherry, female   DOB: [birth year], 62 y.o.   MRN: [id number]  Chief Complaint  Patient presents with   New Patient (Initial Visit)    NP consult; Varicose veins of bilateral lower extremities with other complications    HPI Melissa Cherry is a 62 y.o. female.  I am asked to see the patient by Dr. Sherrie Mustache for evaluation of painful varicose veins.  The patient presents with complaints of symptomatic varicosities of the lower extremities. The patient reports a long standing history of varicosities and they have become painful over time. There was no clear inciting event or causative factor that started the symptoms.  The legs are affected roughly equally. The patient elevates the legs for relief. The pain is described as stinging and burning.  She has lots of itching overlying the varicosities. The symptoms are generally most severe in the evening, particularly when they have been on their feet for long periods of time. Elevation has been used to try to improve the symptoms with limited success. The patient complains of intermittent swelling as an associated symptom. The patient has no previous history of deep venous thrombosis or superficial thrombophlebitis to their knowledge.     Past Medical History:  Diagnosis Date   Complication of anesthesia    too high of episural   GERD (gastroesophageal reflux disease)    Hypercholesteremia    Hypothyroidism    Morbid obesity    OSA (obstructive sleep apnea)     Past Surgical History:  Procedure Laterality Date   CESAREAN SECTION  2000, 2003   DILATION AND CURETTAGE OF UTERUS  1999   GANGLION CYST EXCISION Right 1992   GASTRIC ROUX-EN-Y N/A 04/14/2013   Procedure: LAPAROSCOPIC ROUX-EN-Y GASTRIC BYPASS WITH UPPER ENDOSCOPY;  Surgeon: Atilano Ina, MD;  Location: [facility];  Service: General;  Laterality: N/A;   TONSILLECTOMY  as child    Family History  Problem Relation Age of Onset   Diabetes Father    Heart disease Father     Breast cancer Mother    Dementia Mother    Mental retardation Sister    Hip fracture Sister    Healthy Brother    Healthy Sister       Social History   Tobacco Use   Smoking status: Former    Types: Cigarettes    Quit date: 10/02/1996    Years since quitting: 26.3   Smokeless tobacco: Never  Vaping Use   Vaping Use: Never used  Substance Use Topics   Alcohol use: Yes    Comment: Occasionally; 2-3 beers/week at most (during summer)   Drug use: No     Allergies  Allergen Reactions   Tamiflu [Oseltamivir Phosphate] Rash    Current Outpatient Medications  Medication Sig Dispense Refill   Calcium Carbonate-Vitamin D (CALCIUM 500 + D PO) Take 1 Piece by mouth. Chewable once daily     levothyroxine (SYNTHROID) 50 MCG tablet Take 1 tablet (50 mcg total) by mouth daily before breakfast. 30 tablet 0   loratadine (CLARITIN) 10 MG tablet Take 10 mg by mouth daily as needed for allergies.      Multiple Vitamins-Minerals (MULTIVITAMIN ADULTS 50+ PO) Take 1 tablet by mouth daily.     No current facility-administered medications for this visit.      REVIEW OF SYSTEMS (Negative unless checked)  Constitutional: [] Weight loss  [] Fever  [] Chills Cardiac: [] Chest pain   [] Chest pressure   [] Palpitations   [] Shortness  of breath when laying flat   Shortness of breath at rest   Shortness of breath with exertion. Vascular:  Pain in legs with walking   Pain in legs at rest   Pain in legs when laying flat   Claudication   Pain in feet when walking  Pain in feet at rest  Pain in feet when laying flat   History of DVT   Phlebitis   Swelling in legs   Varicose veins   Non-healing ulcers Pulmonary:   Uses home oxygen   Productive cough   Hemoptysis   Wheeze  COPD   Asthma Neurologic:  Dizziness  Blackouts   Seizures   History of stroke   History of TIA  Aphasia   Temporary blindness   Dysphagia   Weakness or numbness in arms   Weakness  or numbness in legs Musculoskeletal:  Arthritis   Joint swelling   Joint pain   Low back pain Hematologic:  Easy bruising  Easy bleeding   Hypercoagulable state   Anemic  Hepatitis Gastrointestinal:  Blood in stool   Vomiting blood  Gastroesophageal reflux/heartburn   Abdominal pain Genitourinary:  Chronic kidney disease   Difficult urination  Frequent urination  Burning with urination   Hematuria Skin:  Rashes   Ulcers   Wounds Psychological:  History of anxiety    History of major depression.    Physical Exam BP (!) 141/83 (BP Location: Right Arm)   Pulse 67   Resp 18   Ht  (1.549 m)   Wt 211 lb 3.2 oz (95.8 kg)   BMI 39.91 kg/m  Gen:  WD/WN, NAD Head: [place]/AT, No temporalis wasting.  Ear/Nose/Throat: Hearing grossly intact, dentition good Eyes: Sclera non-icteric. Conjunctiva clear Neck: Supple. Trachea midline Pulmonary:  Good air movement, no use of accessory muscles, respirations not labored.  Cardiac: RRR, No JVD Vascular: Varicosities diffuse and measuring up to 2-3 mm in the right lower extremity        Varicosities diffuse and measuring up to 2 mm in the left lower extremity Vessel Right Left  Radial Palpable Palpable                          PT Palpable Palpable  DP Palpable Palpable   Gastrointestinal: soft, non-tender/non-distended.  Musculoskeletal: M/S 5/5 throughout.   Trace BLE edema Neurologic: Sensation grossly intact in extremities.  Symmetrical.  Speech is fluent.  Psychiatric: Judgment intact, Mood & affect appropriate for pt's clinical situation. Dermatologic: No rashes or ulcers noted.  No cellulitis or open wounds.    Radiology No results found.  Labs No results found for this or any previous visit (from the past 2160 hour(s)).  Assessment/Plan:  Dyslipidemia lipid control important in reducing the progression of atherosclerotic disease.    Obesity (BMI 30-39.9) Weight loss of benefits  with leg pain and swelling.  Varicose veins of bilateral lower extremities with other complications   The patient has symptoms consistent with chronic venous insufficiency. We discussed the natural history and treatment options for venous disease. I recommended the regular use of 20 - 30 mm Hg compression stockings, and prescribed these today. I recommended leg elevation and anti-inflammatories as needed for pain. I have also recommended a complete venous duplex to assess the venous system for reflux or thrombotic issues. This can be done at the patient's convenience. I will see the patient back after the duplex to assess the response to conservative management, and  determine further treatment options.     Festus Barren 01/19/2023, 12:05 PM   This note was created with Dragon medical transcription system.  Any errors from dictation are unintentional.

## 2023-01-19 NOTE — Assessment & Plan Note (Signed)
Weight loss of benefits with leg pain and swelling.

## 2023-03-05 ENCOUNTER — Ambulatory Visit: Payer: Self-pay | Admitting: Dermatology

## 2023-03-23 ENCOUNTER — Encounter (INDEPENDENT_AMBULATORY_CARE_PROVIDER_SITE_OTHER): Payer: BC Managed Care – PPO

## 2023-03-23 ENCOUNTER — Ambulatory Visit (INDEPENDENT_AMBULATORY_CARE_PROVIDER_SITE_OTHER): Payer: BC Managed Care – PPO | Admitting: Vascular Surgery

## 2023-07-12 DIAGNOSIS — Z1231 Encounter for screening mammogram for malignant neoplasm of breast: Secondary | ICD-10-CM | POA: Diagnosis not present

## 2023-07-12 LAB — HM MAMMOGRAPHY

## 2023-08-07 ENCOUNTER — Ambulatory Visit (INDEPENDENT_AMBULATORY_CARE_PROVIDER_SITE_OTHER): Payer: BC Managed Care – PPO | Admitting: Family Medicine

## 2023-08-07 ENCOUNTER — Encounter: Payer: Self-pay | Admitting: Family Medicine

## 2023-08-07 VITALS — BP 111/74 | HR 86 | Ht 61.0 in | Wt 214.0 lb

## 2023-08-07 DIAGNOSIS — R239 Unspecified skin changes: Secondary | ICD-10-CM

## 2023-08-07 DIAGNOSIS — R5381 Other malaise: Secondary | ICD-10-CM

## 2023-08-07 DIAGNOSIS — E538 Deficiency of other specified B group vitamins: Secondary | ICD-10-CM

## 2023-08-07 DIAGNOSIS — Z862 Personal history of diseases of the blood and blood-forming organs and certain disorders involving the immune mechanism: Secondary | ICD-10-CM | POA: Insufficient documentation

## 2023-08-07 DIAGNOSIS — E559 Vitamin D deficiency, unspecified: Secondary | ICD-10-CM | POA: Diagnosis not present

## 2023-08-07 DIAGNOSIS — E039 Hypothyroidism, unspecified: Secondary | ICD-10-CM | POA: Diagnosis not present

## 2023-08-07 DIAGNOSIS — R5383 Other fatigue: Secondary | ICD-10-CM

## 2023-08-07 DIAGNOSIS — E669 Obesity, unspecified: Secondary | ICD-10-CM | POA: Diagnosis not present

## 2023-08-07 DIAGNOSIS — R4589 Other symptoms and signs involving emotional state: Secondary | ICD-10-CM

## 2023-08-07 DIAGNOSIS — E7800 Pure hypercholesterolemia, unspecified: Secondary | ICD-10-CM | POA: Diagnosis not present

## 2023-08-07 DIAGNOSIS — R42 Dizziness and giddiness: Secondary | ICD-10-CM

## 2023-08-07 DIAGNOSIS — Z9884 Bariatric surgery status: Secondary | ICD-10-CM

## 2023-08-07 MED ORDER — LEVOTHYROXINE SODIUM 50 MCG PO TABS: 50.0000 ug | ORAL_TABLET | Freq: Every day | ORAL | 1 refills | Status: AC

## 2023-08-07 NOTE — Progress Notes (Signed)
Established Patient Office Visit  Subjective   Patient ID: Melissa Cherry, female    DOB: Dec 02, 1960  Age: 62 y.o. MRN: [id number]  No chief complaint on file.   HPI  Discussed the use of AI scribe software for clinical note transcription with the patient, who gave verbal consent to proceed.  History of Present Illness   The patient, with a history of thyroid issues and iron deficiency, presents with recent episodes of dizziness and lightheadedness. They suspect that their symptoms may be due to low iron levels, as they have experienced this in the past. They have been off their thyroid medication and have noticed a significant decrease in energy levels, altered sleep patterns, and an overall feeling of exhaustion.  The patient also reports feelings of depression, but is unsure if this is due to their physical exhaustion or if it is a separate issue. 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  In addition to these issues, the patient has varicose veins and has expressed concern about potential blood clots. They have been experiencing itching in the affected areas and have been feeling anxious about the possibility of a blood clot.  The patient also mentions weight gain, which they attribute to their lack of energy and inability to exercise. They express a desire to get back on their thyroid medication and to have their iron levels checked.         ROS    Objective:     BP 111/74   Pulse 86   Ht 5\' 1"  (1.549 m)   Wt 214 lb (97.1 kg)   SpO2 98%   BMI 40.43 kg/m    Physical Exam Vitals reviewed.  Constitutional:      General: She is not in acute distress.    Appearance: Normal appearance. She is well-developed. She is not diaphoretic.  HENT:     Head: Normocephalic and atraumatic.  Eyes:     General: No scleral icterus.    Conjunctiva/sclera:  Conjunctivae normal.  Neck:     Thyroid: No thyromegaly.  Cardiovascular:     Rate and Rhythm: Normal rate and regular rhythm.     Pulses: Normal pulses.     Heart sounds: Normal heart sounds. No murmur heard. Pulmonary:     Effort: Pulmonary effort is normal. No respiratory distress.     Breath sounds: Normal breath sounds. No wheezing, rhonchi or rales.  Musculoskeletal:     Cervical back: Neck supple.     Right lower leg: No edema.     Left lower leg: No edema.  Lymphadenopathy:     Cervical: No cervical adenopathy.  Skin:    General: Skin is warm and dry.     Findings: No rash.  Neurological:     Mental Status: She is alert and oriented to person, place, and time. Mental status is at baseline.      No results found for any visits on 08/07/23.    The 10-year ASCVD risk score (Arnett DK, et al., 2019) is: 3.1%    Assessment & Plan:   Problem List Items Addressed This Visit       Endocrine   Hypothyroidism - Primary   Relevant Medications   levothyroxine (SYNTHROID) 50 MCG tablet   Other Relevant Orders   TSH     Other   Hypercholesteremia   Relevant Orders   Comprehensive  metabolic panel   Lipid panel   History of Lap Roux-en-Y gastric bypass 04/14/13   Obesity (BMI 30-39.9)   Malaise and fatigue   Avitaminosis D   Relevant Orders   VITAMIN D 25 Hydroxy (Vit-D Deficiency, Fractures)   B12 deficiency   Relevant Orders   B12   History of anemia   Relevant Orders   Iron, TIBC and Ferritin Panel   CBC with Differential/Platelet   Other Visit Diagnoses     Recent skin changes       Relevant Orders   Ambulatory referral to Dermatology   Lightheadedness       Symptoms of depression               Dizziness and Lightheadedness Recent episode likely related to low iron levels. History of low iron levels. Resumed iron supplementation. - Check iron levels - Check blood counts - Check kidney and liver function  Hypothyroidism Intermittent  medication adherence with symptoms of fatigue, mood changes, and weight gain. Significant mood changes affecting family dynamics. - Check thyroid function - Refill thyroid medication - Recheck thyroid function in 2-3 months  Depression High depression screening score likely related to altered sleep, fatigue, and decreased energy. Contributing factors include caregiving and work stress, and recent life changes. - Check vitamin D and B12 levels - Reassess mood after lab results and thyroid management  Varicose Veins Worsening varicose veins with anxiety about potential blood clots. Previous evaluation showed no clots. Missed previous duplex ultrasound due to scheduling issues. - Elevate legs - Use compression stockings - Reschedule duplex ultrasound  General Health Maintenance Routine health maintenance including skin checks due to fair skin and sun exposure. Prefers referral to [facility] in [place]. - Send referral to [facility] in [place] - Schedule physical in 4 months  Follow-up - Follow-up on lab results via MyChart - Recheck thyroid function in 2-3 months - Schedule physical in 4 months.        Return in about 3 months (around 11/07/2023) for CPE.    Shirlee Latch, MD

## 2023-08-08 LAB — COMPREHENSIVE METABOLIC PANEL
ALT: 14 [IU]/L (ref 0–32)
AST: 23 [IU]/L (ref 0–40)
Albumin: 4.3 g/dL (ref 3.9–4.9)
Alkaline Phosphatase: 112 [IU]/L (ref 44–121)
BUN/Creatinine Ratio: 13 (ref 12–28)
BUN: 10 mg/dL (ref 8–27)
Bilirubin Total: 0.3 mg/dL (ref 0.0–1.2)
CO2: 22 mmol/L (ref 20–29)
Calcium: 9.2 mg/dL (ref 8.7–10.3)
Chloride: 103 mmol/L (ref 96–106)
Creatinine, Ser: 0.75 mg/dL (ref 0.57–1.00)
Globulin, Total: 2.1 g/dL (ref 1.5–4.5)
Glucose: 94 mg/dL (ref 70–99)
Potassium: 4.4 mmol/L (ref 3.5–5.2)
Sodium: 140 mmol/L (ref 134–144)
Total Protein: 6.4 g/dL (ref 6.0–8.5)
eGFR: 89 mL/min/{1.73_m2} (ref >59)

## 2023-08-08 LAB — CBC WITH DIFFERENTIAL/PLATELET
Basophils Absolute: 0.1 10*3/uL (ref 0.0–0.2)
Basos: 1 %
EOS (ABSOLUTE): 0.2 10*3/uL (ref 0.0–0.4)
Eos: 4 %
Hematocrit: 41.6 % (ref 34.0–46.6)
Hemoglobin: 13.7 g/dL (ref 11.1–15.9)
Immature Grans (Abs): 0 10*3/uL (ref 0.0–0.1)
Immature Granulocytes: 0 %
Lymphocytes Absolute: 2.3 10*3/uL (ref 0.7–3.1)
Lymphs: 37 %
MCH: 31.8 pg (ref 26.6–33.0)
MCHC: 32.9 g/dL (ref 31.5–35.7)
MCV: 97 fL (ref 79–97)
Monocytes Absolute: 0.5 10*3/uL (ref 0.1–0.9)
Monocytes: 7 %
Neutrophils Absolute: 3.2 10*3/uL (ref 1.4–7.0)
Neutrophils: 51 %
Platelets: 287 10*3/uL (ref 150–450)
RBC: 4.31 x10E6/uL (ref 3.77–5.28)
RDW: 13.4 % (ref 11.7–15.4)
WBC: 6.3 10*3/uL (ref 3.4–10.8)

## 2023-08-08 LAB — LIPID PANEL
Chol/HDL Ratio: 3.3 ratio (ref 0.0–4.4)
Cholesterol, Total: 231 mg/dL — ABNORMAL HIGH (ref 100–199)
HDL: 70 mg/dL (ref >39)
LDL Chol Calc (NIH): 142 mg/dL — ABNORMAL HIGH (ref 0–99)
Triglycerides: 106 mg/dL (ref 0–149)
VLDL Cholesterol Cal: 19 mg/dL (ref 5–40)

## 2023-08-08 LAB — IRON,TIBC AND FERRITIN PANEL
Ferritin: 23 ng/mL (ref 15–150)
Iron Saturation: 17 % (ref 15–55)
Iron: 70 ug/dL (ref 27–139)
Total Iron Binding Capacity: 420 ug/dL (ref 250–450)
UIBC: 350 ug/dL (ref 118–369)

## 2023-08-08 LAB — VITAMIN D 25 HYDROXY (VIT D DEFICIENCY, FRACTURES): Vit D, 25-Hydroxy: 20.8 ng/mL — ABNORMAL LOW (ref 30.0–100.0)

## 2023-08-08 LAB — VITAMIN B12: Vitamin B-12: 204 pg/mL — ABNORMAL LOW (ref 232–1245)

## 2023-08-08 LAB — TSH: TSH: 5.05 u[IU]/mL — ABNORMAL HIGH (ref 0.450–4.500)

## 2023-08-27 ENCOUNTER — Telehealth: Payer: Self-pay

## 2023-08-27 NOTE — Telephone Encounter (Signed)
Appt moved to 08/28/2023 at 120pm

## 2023-08-27 NOTE — Telephone Encounter (Signed)
Copied from CRM [id number]. Topic: Appointment Scheduling - Scheduling Inquiry for Clinic >> Aug 27, 2023  9:51 AM Franchot Heidelberg wrote: Reason for CRM: pt has a cyst on her back that she wants lanced, please advise   Best contact: [phone]

## 2023-08-27 NOTE — Telephone Encounter (Signed)
Appt scheduled with Kellie for 08/29/2023 at 340pm

## 2023-08-28 ENCOUNTER — Encounter: Payer: Self-pay | Admitting: Family Medicine

## 2023-08-28 ENCOUNTER — Ambulatory Visit (INDEPENDENT_AMBULATORY_CARE_PROVIDER_SITE_OTHER): Payer: BC Managed Care – PPO | Admitting: Family Medicine

## 2023-08-28 VITALS — BP 120/85 | HR 63 | Resp 16 | Ht 61.0 in | Wt 214.9 lb

## 2023-08-28 DIAGNOSIS — Z1211 Encounter for screening for malignant neoplasm of colon: Secondary | ICD-10-CM | POA: Diagnosis not present

## 2023-08-28 DIAGNOSIS — D235 Other benign neoplasm of skin of trunk: Secondary | ICD-10-CM | POA: Diagnosis not present

## 2023-08-28 MED ORDER — SULFAMETHOXAZOLE-TRIMETHOPRIM 800-160 MG PO TABS: 1.0000 | ORAL_TABLET | Freq: Two times a day (BID) | ORAL | 0 refills | Status: DC

## 2023-08-28 NOTE — Assessment & Plan Note (Signed)
Pt last cologuard 04/22/2020 - due for screening. Pt agreeable to screening, will order for pt.

## 2023-08-28 NOTE — Progress Notes (Signed)
Established patient visit   Patient: Melissa Cherry   DOB: [birth year]   62 y.o. Female  MRN: [id number] Visit Date: 08/28/2023  Today's healthcare provider: Sallee Provencal, FNP   Chief Complaint  Patient presents with   Cyst    Cyst draining and swollen on back   Introduced to nurse practitioner role and practice setting.  All questions answered.  Discussed provider/patient relationship and expectations.   Subjective    Pt presents for concerns for cyst on back that she feels is swollen and draining. States it appeared with in the last week, and opened up yesterday. She says she has a history of forming cysts. It is not painful, and she denies all symptoms of systemic infection. Vitals are stable today. She would like the lesion assessed for potential infection and assistance wit drainage.   Marland Kitchen HPI     Cyst    Additional comments: Cyst draining and swollen on back      Last edited by Sallee Provencal, FNP on 08/28/2023  2:33 PM.      Medications: Outpatient Medications Prior to Visit  Medication Sig   Calcium Carbonate-Vitamin D (CALCIUM 500 + D PO) Take 1 Piece by mouth. Chewable once daily   Fexofenadine HCl (MUCINEX ALLERGY PO) Take by mouth. Taking half pill daily   levothyroxine (SYNTHROID) 50 MCG tablet Take 1 tablet (50 mcg total) by mouth daily before breakfast.   loratadine (CLARITIN) 10 MG tablet Take 10 mg by mouth daily as needed for allergies.   Multiple Vitamins-Minerals (MULTIVITAMIN ADULTS 50+ PO) Take 1 tablet by mouth daily.   No facility-administered medications prior to visit.    Review of Systems Negative unless stated in HPI.    Objective    BP 120/85 (BP Location: Left Arm, Patient Position: Sitting, Cuff Size: Large)   Pulse 63   Resp 16   Ht 5\' 1"  (1.549 m)   Wt 214 lb 14.4 oz (97.5 kg)   BMI 40.60 kg/m   Physical Exam Constitutional:      Appearance: Normal appearance. She is normal weight.  HENT:     Head: Normocephalic and  atraumatic.  Eyes:     Pupils: Pupils are equal, round, and reactive to light.  Cardiovascular:     Rate and Rhythm: Normal rate.     Pulses: Normal pulses.  Pulmonary:     Effort: Pulmonary effort is normal.  Musculoskeletal:        General: Normal range of motion.     Cervical back: Normal range of motion and neck supple.  Skin:    General: Skin is warm and dry.     Capillary Refill: Capillary refill takes less than 2 seconds.     Findings: Lesion present.          Comments: See cyst media image.approx 2.5 x 2.5 [email or web address] elevated, erythematous. Scant purulent drainage present.  Neurological:     General: No focal deficit present.     Mental Status: She is alert and oriented to person, place, and time. Mental status is at baseline.  Psychiatric:        Mood and Affect: Mood normal.        Behavior: Behavior normal.        Thought Content: Thought content normal.        Judgment: Judgment normal.       No results found for any visits on 08/28/23.   Assessment & Plan  Problem List Items Addressed This Visit       Musculoskeletal and Integument   Dermoid cyst of skin of back - Primary    Cyst located medially inferiorly on pt's back.  Expressed scan amount of purulent drainage, followed by medium amount of serosanguinous, using manual pressure with finger and use of gauze. Applied bandage after.  Given appearance of mild edema and erythema will prescribe a 5 day course of bactrim.  May apply warm compresses.  May use OTC PRID to help draw out fluid.  Discussed referral to ambulatory surgery with pt - will hold off for now.   Discussed signs and symptoms of worsening infection and to please follow up sooner if these present. Fever, chills, rigors, tachycardia, difficulty breathing, and worsening appearance of cyst.       Relevant Medications   sulfamethoxazole-trimethoprim (BACTRIM DS) 800-160 MG tablet     Other   Screen for colon cancer    Pt last cologuard  04/22/2020 - due for screening. Pt agreeable to screening, will order for pt.       Relevant Orders   Cologuard     Return if symptoms worsen or fail to improve, for cyst.      I, Sallee Provencal, FNP, have reviewed all documentation for this visit. The documentation on 08/28/23 for the exam, diagnosis, procedures, and orders are all accurate and complete.   Sallee Provencal, FNP  [facility] [phone] (phone) [phone] (fax)  [facility]

## 2023-08-28 NOTE — Assessment & Plan Note (Signed)
Cyst located medially inferiorly on pt's back.  Expressed scan amount of purulent drainage, followed by medium amount of serosanguinous, using manual pressure with finger and use of gauze. Applied bandage after.  Given appearance of mild edema and erythema will prescribe a 5 day course of bactrim.  May apply warm compresses.  May use OTC PRID to help draw out fluid.  Discussed referral to ambulatory surgery with pt - will hold off for now.   Discussed signs and symptoms of worsening infection and to please follow up sooner if these present. Fever, chills, rigors, tachycardia, difficulty breathing, and worsening appearance of cyst.

## 2023-08-28 NOTE — Patient Instructions (Signed)
Will start five day course of bactrim.  May use warm compresses.  Discussed use of OTC Prid to help draw out cyst fluid.

## 2023-08-29 ENCOUNTER — Ambulatory Visit: Payer: BC Managed Care – PPO | Admitting: Family Medicine

## 2023-10-25 DIAGNOSIS — L821 Other seborrheic keratosis: Secondary | ICD-10-CM | POA: Diagnosis not present

## 2023-10-25 DIAGNOSIS — E538 Deficiency of other specified B group vitamins: Secondary | ICD-10-CM | POA: Diagnosis not present

## 2023-10-25 DIAGNOSIS — I83893 Varicose veins of bilateral lower extremities with other complications: Secondary | ICD-10-CM | POA: Diagnosis not present

## 2023-10-25 DIAGNOSIS — E039 Hypothyroidism, unspecified: Secondary | ICD-10-CM | POA: Diagnosis not present

## 2023-10-25 DIAGNOSIS — E559 Vitamin D deficiency, unspecified: Secondary | ICD-10-CM | POA: Diagnosis not present

## 2023-10-25 DIAGNOSIS — D229 Melanocytic nevi, unspecified: Secondary | ICD-10-CM | POA: Diagnosis not present

## 2023-10-25 DIAGNOSIS — K760 Fatty (change of) liver, not elsewhere classified: Secondary | ICD-10-CM | POA: Diagnosis not present

## 2023-10-25 DIAGNOSIS — Z1211 Encounter for screening for malignant neoplasm of colon: Secondary | ICD-10-CM | POA: Diagnosis not present

## 2023-10-25 DIAGNOSIS — E7800 Pure hypercholesterolemia, unspecified: Secondary | ICD-10-CM | POA: Diagnosis not present

## 2023-10-25 DIAGNOSIS — L814 Other melanin hyperpigmentation: Secondary | ICD-10-CM | POA: Diagnosis not present

## 2023-10-25 DIAGNOSIS — Z780 Asymptomatic menopausal state: Secondary | ICD-10-CM | POA: Diagnosis not present

## 2023-10-25 DIAGNOSIS — D649 Anemia, unspecified: Secondary | ICD-10-CM | POA: Diagnosis not present

## 2023-11-08 DIAGNOSIS — I83893 Varicose veins of bilateral lower extremities with other complications: Secondary | ICD-10-CM | POA: Diagnosis not present

## 2023-11-08 DIAGNOSIS — I872 Venous insufficiency (chronic) (peripheral): Secondary | ICD-10-CM | POA: Diagnosis not present

## 2023-11-09 ENCOUNTER — Other Ambulatory Visit: Payer: Self-pay | Admitting: Family Medicine

## 2023-11-12 NOTE — Telephone Encounter (Signed)
 Request too soon for refill, last refill 08/07/23 for 90 and 1 refill.  Requested Prescriptions  Pending Prescriptions Disp Refills   levothyroxine  (SYNTHROID ) 50 MCG tablet [Pharmacy Med Name: LEVOTHYROXINE  SODIUM 50 MCG TAB] 90 tablet 1    Sig: TAKE 1 TABLET BY MOUTH ONCE DAILY ON AN EMPTY STOMACH. WAIT 30 MINUTES BEFORE TAKING OTHER MEDS.     Endocrinology:  Hypothyroid Agents Failed - 11/12/2023 10:55 AM      Failed - TSH in normal range and within 360 days    TSH  Date Value Ref Range Status  08/07/2023 5.050 (H) 0.450 - 4.500 uIU/mL Final         Passed - Valid encounter within last 12 months    Recent Outpatient Visits           2 months ago Dermoid cyst of skin of back   [facility] [place], Ronny Colas, FNP   3 months ago Hypothyroidism, unspecified type   [facility] [place], Stan Eans, MD   10 months ago Varicose veins of bilateral lower extremities with other complications   [facility] Lamon Pillow, MD   1 year ago Encounter for routine adult physical exam with abnormal findings   [facility] [place], Judyann Number, MD   2 years ago Need for shingles vaccine   [facility] [place], Stan Eans, MD

## 2023-11-15 ENCOUNTER — Encounter: Payer: Self-pay | Admitting: Family Medicine

## 2023-12-18 DIAGNOSIS — R87610 Atypical squamous cells of undetermined significance on cytologic smear of cervix (ASC-US): Secondary | ICD-10-CM | POA: Diagnosis not present

## 2023-12-18 DIAGNOSIS — G4733 Obstructive sleep apnea (adult) (pediatric): Secondary | ICD-10-CM | POA: Diagnosis not present

## 2023-12-18 DIAGNOSIS — N898 Other specified noninflammatory disorders of vagina: Secondary | ICD-10-CM | POA: Diagnosis not present

## 2023-12-18 DIAGNOSIS — D649 Anemia, unspecified: Secondary | ICD-10-CM | POA: Diagnosis not present

## 2023-12-18 DIAGNOSIS — E039 Hypothyroidism, unspecified: Secondary | ICD-10-CM | POA: Diagnosis not present

## 2023-12-18 DIAGNOSIS — G479 Sleep disorder, unspecified: Secondary | ICD-10-CM | POA: Diagnosis not present

## 2023-12-18 DIAGNOSIS — E7800 Pure hypercholesterolemia, unspecified: Secondary | ICD-10-CM | POA: Diagnosis not present

## 2023-12-18 DIAGNOSIS — Z01419 Encounter for gynecological examination (general) (routine) without abnormal findings: Secondary | ICD-10-CM | POA: Diagnosis not present

## 2023-12-18 DIAGNOSIS — Z0000 Encounter for general adult medical examination without abnormal findings: Secondary | ICD-10-CM | POA: Diagnosis not present

## 2024-01-16 DIAGNOSIS — Z1211 Encounter for screening for malignant neoplasm of colon: Secondary | ICD-10-CM | POA: Diagnosis not present

## 2024-01-22 DIAGNOSIS — Z780 Asymptomatic menopausal state: Secondary | ICD-10-CM | POA: Diagnosis not present

## 2024-01-22 DIAGNOSIS — M8589 Other specified disorders of bone density and structure, multiple sites: Secondary | ICD-10-CM | POA: Diagnosis not present

## 2024-01-23 DIAGNOSIS — M8589 Other specified disorders of bone density and structure, multiple sites: Secondary | ICD-10-CM | POA: Insufficient documentation

## 2024-01-23 LAB — COLOGUARD: COLOGUARD: NEGATIVE

## 2024-02-19 ENCOUNTER — Encounter (INDEPENDENT_AMBULATORY_CARE_PROVIDER_SITE_OTHER): Payer: Self-pay

## 2024-02-21 ENCOUNTER — Ambulatory Visit: Payer: Self-pay | Admitting: Family Medicine

## 2024-03-03 ENCOUNTER — Other Ambulatory Visit: Payer: Self-pay | Admitting: Family Medicine

## 2024-05-19 ENCOUNTER — Ambulatory Visit
Admission: EM | Admit: 2024-05-19 | Discharge: 2024-05-19 | Disposition: A | Discharge status: Home / Self Care | Attending: Emergency Medicine | Admitting: Emergency Medicine

## 2024-05-19 ENCOUNTER — Encounter: Payer: Self-pay | Admitting: Emergency Medicine

## 2024-05-19 ENCOUNTER — Ambulatory Visit (INDEPENDENT_AMBULATORY_CARE_PROVIDER_SITE_OTHER)

## 2024-05-19 DIAGNOSIS — R079 Chest pain, unspecified: Secondary | ICD-10-CM | POA: Diagnosis not present

## 2024-05-19 DIAGNOSIS — R062 Wheezing: Secondary | ICD-10-CM | POA: Diagnosis not present

## 2024-05-19 DIAGNOSIS — R051 Acute cough: Secondary | ICD-10-CM

## 2024-05-19 DIAGNOSIS — J069 Acute upper respiratory infection, unspecified: Secondary | ICD-10-CM | POA: Diagnosis not present

## 2024-05-19 DIAGNOSIS — R059 Cough, unspecified: Secondary | ICD-10-CM | POA: Diagnosis not present

## 2024-05-19 DIAGNOSIS — R0602 Shortness of breath: Secondary | ICD-10-CM | POA: Diagnosis not present

## 2024-05-19 MED ORDER — AEROCHAMBER MV MISC: 2 refills | Status: AC

## 2024-05-19 MED ORDER — PROMETHAZINE-DM 6.25-15 MG/5ML PO SYRP: 5.0000 mL | ORAL_SOLUTION | Freq: Four times a day (QID) | ORAL | 0 refills | Status: AC | PRN

## 2024-05-19 MED ORDER — IPRATROPIUM BROMIDE 0.06 % NA SOLN: 2.0000 | Freq: Four times a day (QID) | NASAL | 12 refills | Status: AC

## 2024-05-19 MED ORDER — BENZONATATE 100 MG PO CAPS: 200.0000 mg | ORAL_CAPSULE | Freq: Three times a day (TID) | ORAL | 0 refills | Status: AC

## 2024-05-19 MED ORDER — ALBUTEROL SULFATE HFA 108 (90 BASE) MCG/ACT IN AERS: 2.0000 | INHALATION_SPRAY | RESPIRATORY_TRACT | 0 refills | Status: AC | PRN

## 2024-05-19 MED ORDER — AMOXICILLIN-POT CLAVULANATE 875-125 MG PO TABS: 1.0000 | ORAL_TABLET | Freq: Two times a day (BID) | ORAL | 0 refills | Status: AC

## 2024-05-19 NOTE — ED Provider Notes (Addendum)
 [facility]    CSN: [id number] Arrival date & time: 05/19/24  1710      History   Chief Complaint Chief Complaint  Patient presents with   Cough   Shortness of Breath    HPI Melissa Cherry is a 63 y.o. female.   HPI  63 year old female with past medical history significant for OSA, obesity, hypothyroidism, high cholesterol, and GERD presents for evaluation of cough and shortness of breath that has been going on for the last 2 weeks.  Patient states that it is worse with exertion and she has chest pain at times.  No history of asthma or COPD.  Patient has used Mucinex  and cough lozenges without improvement of symptoms.  Past Medical History:  Diagnosis Date   Complication of anesthesia    too high of episural   GERD (gastroesophageal reflux disease)    Hypercholesteremia    Hypothyroidism    Morbid obesity (HCC)    OSA (obstructive sleep apnea)     Patient Active Problem List   Diagnosis Date Noted   Osteopenia of multiple sites 01/23/2024   Dermoid cyst of skin of back 08/28/2023   Screen for colon cancer 08/28/2023   History of anemia 08/07/2023   Absolute anemia 06/30/2022   B12 deficiency 06/30/2022   Varicose veins of bilateral lower extremities with other complications 06/30/2022   Plantar fascial fibromatosis of left foot 11/09/2016   Allergic rhinitis 09/01/2015   Avitaminosis D 09/01/2015   Hypothyroidism 03/08/2015   Obesity (BMI 30-39.9) 12/17/2013   History of Lap Roux-en-Y gastric bypass 04/14/13 05/02/2013   OSA on CPAP 04/15/2013   Hypercholesteremia 04/15/2013   Fatty liver 04/15/2013   Adiposity 01/08/2010   Malaise and fatigue 01/06/2010   H/O contraceptive use 12/11/2008   Family history of cardiovascular disease 12/02/2008   History of tobacco use 12/02/2008   History of methicillin resistant Staphylococcus aureus infection 11/25/2008    Past Surgical History:  Procedure Laterality Date   CESAREAN SECTION  2000, 2003    DILATION AND CURETTAGE OF UTERUS  1999   GANGLION CYST EXCISION Right 1992   GASTRIC ROUX-EN-Y N/A 04/14/2013   Procedure: LAPAROSCOPIC ROUX-EN-Y GASTRIC BYPASS WITH UPPER ENDOSCOPY;  Surgeon: Camellia CHRISTELLA Blush, MD;  Location: [facility];  Service: General;  Laterality: N/A;   TONSILLECTOMY  as child    OB History     Gravida  4   Para  2   Term      Preterm      AB      Living         SAB      IAB      Ectopic      Multiple      Live Births               Home Medications    Prior to Admission medications   Medication Sig Start Date End Date Taking? Authorizing Provider  albuterol  (VENTOLIN  HFA) 108 (90 Base) MCG/ACT inhaler Inhale 2 puffs into the lungs every 4 (four) hours as needed. 05/19/24  Yes Bernardino Ditch, NP  amoxicillin -clavulanate (AUGMENTIN ) 875-125 MG tablet Take 1 tablet by mouth every 12 (twelve) hours for 7 days. 05/19/24 05/26/24 Yes Bernardino Ditch, NP  benzonatate  (TESSALON ) 100 MG capsule Take 2 capsules (200 mg total) by mouth every 8 (eight) hours. 05/19/24  Yes Bernardino Ditch, NP  ipratropium (ATROVENT ) 0.06 % nasal spray Place 2 sprays into both nostrils 4 (four) times daily. 05/19/24  Yes Bernardino Ditch, NP  promethazine -dextromethorphan (PROMETHAZINE -DM) 6.25-15 MG/5ML syrup Take 5 mLs by mouth 4 (four) times daily as needed. 05/19/24  Yes Bernardino Ditch, NP  Spacer/Aero-Holding Chambers (AEROCHAMBER MV) inhaler Use as instructed 05/19/24  Yes Bernardino Ditch, NP  Calcium Carbonate-Vitamin D  (CALCIUM 500 + D PO) Take 1 Piece by mouth. Chewable once daily    [provider]  Fexofenadine HCl (MUCINEX  ALLERGY PO) Take by mouth. Taking half pill daily    [provider]  levothyroxine  (SYNTHROID ) 50 MCG tablet Take 1 tablet (50 mcg total) by mouth daily before breakfast. 08/07/23   Bacigalupo, Jon HERO, MD  loratadine (CLARITIN) 10 MG tablet Take 10 mg by mouth daily as needed for allergies.    [provider]  Multiple Vitamins-Minerals  (MULTIVITAMIN ADULTS 50+ PO) Take 1 tablet by mouth daily.    [provider]    Family History Family History  Problem Relation Age of Onset   Diabetes Father    Heart disease Father    Breast cancer Mother    Dementia Mother    Mental retardation Sister    Hip fracture Sister    Healthy Brother    Healthy Sister     Social History Social History   Tobacco Use   Smoking status: Former    Current packs/day: 0.00    Types: Cigarettes    Quit date: 10/02/1996    Years since quitting: 27.6   Smokeless tobacco: Never  Vaping Use   Vaping status: Never Used  Substance Use Topics   Alcohol use: Yes    Comment: Occasionally; 2-3 beers/week at most (during summer)   Drug use: No     Allergies   Tamiflu [oseltamivir phosphate]   Review of Systems Review of Systems  Constitutional:  Negative for fever.  HENT:  Positive for congestion and rhinorrhea. Negative for ear pain and sore throat.   Respiratory:  Positive for cough, shortness of breath and wheezing.      Physical Exam Triage Vital Signs ED Triage Vitals  Encounter Vitals Group     BP      Girls Systolic BP Percentile      Girls Diastolic BP Percentile      Boys Systolic BP Percentile      Boys Diastolic BP Percentile      Pulse      Resp      Temp      Temp src      SpO2      Weight      Height      Head Circumference      Peak Flow      Pain Score      Pain Loc      Pain Education      Exclude from Growth Chart    No data found.  Updated Vital Signs BP (!) 141/88 (BP Location: Right Arm)   Pulse 62   Temp 98.4 F (36.9 C) (Oral)   Resp 16   Ht 5' 2 (1.575 m)   Wt 220 lb 8 oz (100 kg)   SpO2 96%   BMI 40.33 kg/m   Visual Acuity Right Eye Distance:   Left Eye Distance:   Bilateral Distance:    Right Eye Near:   Left Eye Near:    Bilateral Near:     Physical Exam Vitals and nursing note reviewed.  Constitutional:      Appearance: Normal appearance. She is not  ill-appearing.  HENT:  Head: Normocephalic and atraumatic.     Right Ear: Tympanic membrane, ear canal and external ear normal. There is no impacted cerumen.     Left Ear: Tympanic membrane, ear canal and external ear normal. There is no impacted cerumen.     Nose: Congestion and rhinorrhea present.     Comments: Please mucosa is edematous and erythematous with clear discharge in both nares.    Mouth/Throat:     Mouth: Mucous membranes are moist.     Pharynx: Oropharynx is clear. No oropharyngeal exudate or posterior oropharyngeal erythema.  Cardiovascular:     Rate and Rhythm: Normal rate and regular rhythm.     Pulses: Normal pulses.     Heart sounds: Normal heart sounds. No murmur heard.    No friction rub. No gallop.  Pulmonary:     Effort: Pulmonary effort is normal.     Breath sounds: Rhonchi present. No wheezing or rales.     Comments: Coarse rhonchi on exhalation only. Musculoskeletal:     Cervical back: Normal range of motion and neck supple. No tenderness.  Lymphadenopathy:     Cervical: No cervical adenopathy.  Skin:    General: Skin is warm and dry.     Capillary Refill: Capillary refill takes less than 2 seconds.     Findings: No rash.  Neurological:     General: No focal deficit present.     Mental Status: She is alert and oriented to person, place, and time.      UC Treatments / Results  Labs (all labs ordered are listed, but only abnormal results are displayed) Labs Reviewed - No data to display  EKG Normal sinus rhythm with a ventricular rate of 66 bpm PR interval 158 ms QRS duration 74 ms QT/QTc 434/454 ms Left axis deviation.  No ST or T wave maladies noted.  Radiology No results found.  Procedures Procedures (including critical care time)  Medications Ordered in UC Medications - No data to display  Initial Impression / Assessment and Plan / UC Course  I have reviewed the triage vital signs and the nursing notes.  Pertinent labs &  imaging results that were available during my care of the patient were reviewed by me and considered in my medical decision making (see chart for details).   Patient is a pleasant, nontoxic-appearing 63 year old female presenting for evaluation of 2 weeks worth of respiratory complaints.  She reports that at the outset of symptoms she had a fever as well as runny nose and nasal congestion but those symptoms have largely resolved.  She feels like she has mucus caught in her upper chest and she has had some shortness of breath and wheezing.  The shortness of breath and wheezing are worse with exertion and she is also had some intermittent chest pain not having any chest pain at present.  Her physical exam does reveal inflamed nasal mucosa with clear rhinorrhea.  Oropharyngeal exam is benign.  Cardiopulmonary exam reveals coarse lung sounds with exhalation only.  The coarse lung sounds are diffuse.  Patient is able to speak in full sentence without dyspnea or tachypnea.  Differential diagnose include upper respiratory tract infection, pneumonia, and bronchitis.  Because she has had intermittent chest pain as well as some intermittent dizziness I will order an EKG as well as a chest x-Bhatnagar to evaluate for any acute cardiopulmonary pathology.  EKG shows normal sinus rhythm without any ST or T wave abnormalities noted.  Left axis deviation new on EKG today  when compared to EKG from 02/25/2013.  No other abnormalities noted.  Chest x-Kaner independently reviewed and evaluated by me.  Impression: Lung fields are well aerated without evidence of infiltrate or effusion.  Cardiomediastinal silhouette appears normal.  Radiology overread is pending. Radiology impression states no active cardiopulmonary disease.  I will discharge patient home with diagnosis of URI with cough and congestion.  I will start her on Augmentin  875 mg twice daily with food for 7 days.  Additionally, I will prescribe an albuterol  inhaler and she can  take 1 to 2 puffs every 4-6 hours as needed for any shortness of breath or wheezing.  Atrovent  nasal spray for the nasal congestion every 6 hours as needed.  Tessalon  Perles Promethazine  DM cough syrup for cough and congestion.  Return precautions reviewed.  Final Clinical Impressions(s) / UC Diagnoses   Final diagnoses:  Acute cough  URI with cough and congestion     Discharge Instructions      Your chest x-Brayman did not show any evidence of pneumonia.  Your exam is consistent with an upper respiratory infection and I do believe that your postnasal drip is what is triggering your cough.  Take the Augmentin  twice daily with food for 7 days for treatment of your upper respiratory tract infection.  Use the albuterol  inhaler, with the spacer, and take 1 to 2 puffs every 4-6 hours as needed for any shortness of breath or wheezing.  Use the Atrovent  nasal spray, 2 squirts in each nostril every 6 hours, as needed for runny nose and postnasal drip.  Use the Tessalon  Perles every 8 hours during the day.  Take them with a small sip of water.  They may give you some numbness to the base of your tongue or a metallic taste in your mouth, this is normal.  Use the Promethazine  DM cough syrup at bedtime for cough and congestion.  It will make you drowsy so do not take it during the day.  Return for reevaluation or see your primary care provider for any new or worsening symptoms.      ED Prescriptions     Medication Sig Dispense Auth. Provider   Spacer/Aero-Holding Chambers (AEROCHAMBER MV) inhaler Use as instructed 1 each Bernardino Ditch, NP   albuterol  (VENTOLIN  HFA) 108 (90 Base) MCG/ACT inhaler Inhale 2 puffs into the lungs every 4 (four) hours as needed. 18 g Bernardino Ditch, NP   amoxicillin -clavulanate (AUGMENTIN ) 875-125 MG tablet Take 1 tablet by mouth every 12 (twelve) hours for 7 days. 14 tablet Bernardino Ditch, NP   benzonatate  (TESSALON ) 100 MG capsule Take 2 capsules (200 mg total) by mouth  every 8 (eight) hours. 21 capsule Bernardino Ditch, NP   ipratropium (ATROVENT ) 0.06 % nasal spray Place 2 sprays into both nostrils 4 (four) times daily. 15 mL Bernardino Ditch, NP   promethazine -dextromethorphan (PROMETHAZINE -DM) 6.25-15 MG/5ML syrup Take 5 mLs by mouth 4 (four) times daily as needed. 118 mL Bernardino Ditch, NP      PDMP not reviewed this encounter.   Bernardino Ditch, NP 05/19/24 1944    Bernardino Ditch, NP 05/19/24 [phone]

## 2024-05-19 NOTE — Discharge Instructions (Addendum)
 Your chest x-Dearinger did not show any evidence of pneumonia.  Your exam is consistent with an upper respiratory infection and I do believe that your postnasal drip is what is triggering your cough.  Take the Augmentin  twice daily with food for 7 days for treatment of your upper respiratory tract infection.  Use the albuterol  inhaler, with the spacer, and take 1 to 2 puffs every 4-6 hours as needed for any shortness of breath or wheezing.  Use the Atrovent  nasal spray, 2 squirts in each nostril every 6 hours, as needed for runny nose and postnasal drip.  Use the Tessalon  Perles every 8 hours during the day.  Take them with a small sip of water.  They may give you some numbness to the base of your tongue or a metallic taste in your mouth, this is normal.  Use the Promethazine  DM cough syrup at bedtime for cough and congestion.  It will make you drowsy so do not take it during the day.  Return for reevaluation or see your primary care provider for any new or worsening symptoms.

## 2024-05-19 NOTE — ED Triage Notes (Signed)
 Pt c/o cough & sob x2 wks. States worse w/exertion & chest pain at times. Denies any hx of asthma or COPD. Has tried mucinex  & cough lozenges.

## 2024-06-17 DIAGNOSIS — E559 Vitamin D deficiency, unspecified: Secondary | ICD-10-CM | POA: Diagnosis not present

## 2024-06-17 DIAGNOSIS — E039 Hypothyroidism, unspecified: Secondary | ICD-10-CM | POA: Diagnosis not present

## 2024-06-17 DIAGNOSIS — E66813 Obesity, class 3: Secondary | ICD-10-CM | POA: Diagnosis not present

## 2024-06-17 DIAGNOSIS — G4733 Obstructive sleep apnea (adult) (pediatric): Secondary | ICD-10-CM | POA: Diagnosis not present

## 2024-06-17 DIAGNOSIS — E7800 Pure hypercholesterolemia, unspecified: Secondary | ICD-10-CM | POA: Diagnosis not present

## 2024-06-17 DIAGNOSIS — D649 Anemia, unspecified: Secondary | ICD-10-CM | POA: Diagnosis not present

## 2024-06-25 DIAGNOSIS — M1612 Unilateral primary osteoarthritis, left hip: Secondary | ICD-10-CM | POA: Diagnosis not present

## 2024-07-06 DIAGNOSIS — Z888 Allergy status to other drugs, medicaments and biological substances status: Secondary | ICD-10-CM | POA: Diagnosis not present

## 2024-07-06 DIAGNOSIS — W01198A Fall on same level from slipping, tripping and stumbling with subsequent striking against other object, initial encounter: Secondary | ICD-10-CM | POA: Diagnosis not present

## 2024-07-06 DIAGNOSIS — S82142A Displaced bicondylar fracture of left tibia, initial encounter for closed fracture: Secondary | ICD-10-CM | POA: Diagnosis not present

## 2024-07-06 DIAGNOSIS — M25462 Effusion, left knee: Secondary | ICD-10-CM | POA: Diagnosis not present

## 2024-07-06 DIAGNOSIS — S80912A Unspecified superficial injury of left knee, initial encounter: Secondary | ICD-10-CM | POA: Diagnosis not present

## 2024-07-06 DIAGNOSIS — S82145A Nondisplaced bicondylar fracture of left tibia, initial encounter for closed fracture: Secondary | ICD-10-CM | POA: Diagnosis not present

## 2024-07-06 DIAGNOSIS — S82002A Unspecified fracture of left patella, initial encounter for closed fracture: Secondary | ICD-10-CM | POA: Diagnosis not present

## 2024-07-31 DIAGNOSIS — R262 Difficulty in walking, not elsewhere classified: Secondary | ICD-10-CM | POA: Diagnosis not present

## 2024-07-31 DIAGNOSIS — M25562 Pain in left knee: Secondary | ICD-10-CM | POA: Diagnosis not present

## 2024-08-05 DIAGNOSIS — R262 Difficulty in walking, not elsewhere classified: Secondary | ICD-10-CM | POA: Diagnosis not present

## 2024-08-05 DIAGNOSIS — M25562 Pain in left knee: Secondary | ICD-10-CM | POA: Diagnosis not present

## 2024-08-08 DIAGNOSIS — M25562 Pain in left knee: Secondary | ICD-10-CM | POA: Diagnosis not present

## 2024-08-08 DIAGNOSIS — R262 Difficulty in walking, not elsewhere classified: Secondary | ICD-10-CM | POA: Diagnosis not present

## 2024-08-11 DIAGNOSIS — M25562 Pain in left knee: Secondary | ICD-10-CM | POA: Diagnosis not present

## 2024-08-11 DIAGNOSIS — R262 Difficulty in walking, not elsewhere classified: Secondary | ICD-10-CM | POA: Diagnosis not present

## 2024-08-18 DIAGNOSIS — R262 Difficulty in walking, not elsewhere classified: Secondary | ICD-10-CM | POA: Diagnosis not present

## 2024-08-18 DIAGNOSIS — M25562 Pain in left knee: Secondary | ICD-10-CM | POA: Diagnosis not present

## 2024-08-19 DIAGNOSIS — S82122D Displaced fracture of lateral condyle of left tibia, subsequent encounter for closed fracture with routine healing: Secondary | ICD-10-CM | POA: Diagnosis not present

## 2024-08-20 DIAGNOSIS — R262 Difficulty in walking, not elsewhere classified: Secondary | ICD-10-CM | POA: Diagnosis not present

## 2024-08-20 DIAGNOSIS — M25562 Pain in left knee: Secondary | ICD-10-CM | POA: Diagnosis not present

## 2024-08-25 DIAGNOSIS — M25562 Pain in left knee: Secondary | ICD-10-CM | POA: Diagnosis not present

## 2024-08-25 DIAGNOSIS — R262 Difficulty in walking, not elsewhere classified: Secondary | ICD-10-CM | POA: Diagnosis not present

## 2024-08-29 DIAGNOSIS — R262 Difficulty in walking, not elsewhere classified: Secondary | ICD-10-CM | POA: Diagnosis not present

## 2024-08-29 DIAGNOSIS — M25562 Pain in left knee: Secondary | ICD-10-CM | POA: Diagnosis not present

## 2024-09-02 DIAGNOSIS — R262 Difficulty in walking, not elsewhere classified: Secondary | ICD-10-CM | POA: Diagnosis not present

## 2024-09-02 DIAGNOSIS — M25562 Pain in left knee: Secondary | ICD-10-CM | POA: Diagnosis not present

## 2024-09-04 DIAGNOSIS — M25562 Pain in left knee: Secondary | ICD-10-CM | POA: Diagnosis not present

## 2024-09-04 DIAGNOSIS — R262 Difficulty in walking, not elsewhere classified: Secondary | ICD-10-CM | POA: Diagnosis not present

## 2024-09-08 DIAGNOSIS — R262 Difficulty in walking, not elsewhere classified: Secondary | ICD-10-CM | POA: Diagnosis not present

## 2024-09-08 DIAGNOSIS — M25562 Pain in left knee: Secondary | ICD-10-CM | POA: Diagnosis not present

## 2024-09-11 DIAGNOSIS — R262 Difficulty in walking, not elsewhere classified: Secondary | ICD-10-CM | POA: Diagnosis not present

## 2024-09-11 DIAGNOSIS — M25562 Pain in left knee: Secondary | ICD-10-CM | POA: Diagnosis not present

## 2024-09-12 DIAGNOSIS — R2232 Localized swelling, mass and lump, left upper limb: Secondary | ICD-10-CM | POA: Diagnosis not present

## 2024-09-15 DIAGNOSIS — R262 Difficulty in walking, not elsewhere classified: Secondary | ICD-10-CM | POA: Diagnosis not present

## 2024-09-15 DIAGNOSIS — N879 Dysplasia of cervix uteri, unspecified: Secondary | ICD-10-CM | POA: Diagnosis not present

## 2024-09-15 DIAGNOSIS — M25562 Pain in left knee: Secondary | ICD-10-CM | POA: Diagnosis not present

## 2024-09-15 DIAGNOSIS — R87611 Atypical squamous cells cannot exclude high grade squamous intraepithelial lesion on cytologic smear of cervix (ASC-H): Secondary | ICD-10-CM | POA: Diagnosis not present

## 2024-09-17 DIAGNOSIS — R262 Difficulty in walking, not elsewhere classified: Secondary | ICD-10-CM | POA: Diagnosis not present

## 2024-09-17 DIAGNOSIS — M25562 Pain in left knee: Secondary | ICD-10-CM | POA: Diagnosis not present

## 2024-09-22 DIAGNOSIS — R262 Difficulty in walking, not elsewhere classified: Secondary | ICD-10-CM | POA: Diagnosis not present

## 2024-09-22 DIAGNOSIS — M25562 Pain in left knee: Secondary | ICD-10-CM | POA: Diagnosis not present
